# Patient Record
Sex: Female | Born: 1955 | Race: White | Hispanic: No | Marital: Married | Smoking: Current every day smoker
Health system: Southern US, Community
[De-identification: ages and names within clinical notes are randomized; demographics above are authoritative.]

## PROBLEM LIST (undated history)

## (undated) DIAGNOSIS — F32A Depression, unspecified: Secondary | ICD-10-CM

## (undated) DIAGNOSIS — M1712 Unilateral primary osteoarthritis, left knee: Secondary | ICD-10-CM

## (undated) DIAGNOSIS — M797 Fibromyalgia: Secondary | ICD-10-CM

## (undated) DIAGNOSIS — F329 Major depressive disorder, single episode, unspecified: Secondary | ICD-10-CM

## (undated) DIAGNOSIS — I10 Essential (primary) hypertension: Secondary | ICD-10-CM

## (undated) DIAGNOSIS — K635 Polyp of colon: Secondary | ICD-10-CM

## (undated) DIAGNOSIS — K602 Anal fissure, unspecified: Secondary | ICD-10-CM

## (undated) DIAGNOSIS — M1990 Unspecified osteoarthritis, unspecified site: Secondary | ICD-10-CM

## (undated) DIAGNOSIS — H9190 Unspecified hearing loss, unspecified ear: Secondary | ICD-10-CM

## (undated) HISTORY — PX: CARPAL TUNNEL RELEASE: SHX101

## (undated) HISTORY — PX: TUBAL LIGATION: SHX77

## (undated) HISTORY — DX: Fibromyalgia: M79.7

## (undated) HISTORY — DX: Major depressive disorder, single episode, unspecified: F32.9

## (undated) HISTORY — DX: Anal fissure, unspecified: K60.2

## (undated) HISTORY — DX: Depression, unspecified: F32.A

## (undated) HISTORY — PX: COLONOSCOPY W/ POLYPECTOMY: SHX1380

## (undated) HISTORY — PX: PARTIAL KNEE ARTHROPLASTY: SHX2174

## (undated) HISTORY — DX: Unspecified osteoarthritis, unspecified site: M19.90

## (undated) HISTORY — DX: Polyp of colon: K63.5

---

## 2003-05-02 ENCOUNTER — Other Ambulatory Visit: Admission: RE | Admit: 2003-05-02 | Discharge: 2003-05-02 | Payer: Self-pay | Admitting: Obstetrics and Gynecology

## 2004-05-12 ENCOUNTER — Ambulatory Visit (HOSPITAL_BASED_OUTPATIENT_CLINIC_OR_DEPARTMENT_OTHER): Admission: RE | Admit: 2004-05-12 | Discharge: 2004-05-12 | Payer: Self-pay | Admitting: Family Medicine

## 2004-06-18 ENCOUNTER — Ambulatory Visit (HOSPITAL_BASED_OUTPATIENT_CLINIC_OR_DEPARTMENT_OTHER): Admission: RE | Admit: 2004-06-18 | Discharge: 2004-06-18 | Payer: Self-pay | Admitting: Family Medicine

## 2005-09-29 ENCOUNTER — Ambulatory Visit (HOSPITAL_BASED_OUTPATIENT_CLINIC_OR_DEPARTMENT_OTHER): Admission: RE | Admit: 2005-09-29 | Discharge: 2005-09-29 | Payer: Self-pay | Admitting: Orthopedic Surgery

## 2009-07-30 ENCOUNTER — Encounter (INDEPENDENT_AMBULATORY_CARE_PROVIDER_SITE_OTHER): Payer: Self-pay | Admitting: *Deleted

## 2009-09-02 ENCOUNTER — Encounter (INDEPENDENT_AMBULATORY_CARE_PROVIDER_SITE_OTHER): Payer: Self-pay | Admitting: *Deleted

## 2009-09-03 ENCOUNTER — Ambulatory Visit: Payer: Self-pay | Admitting: Internal Medicine

## 2009-09-17 ENCOUNTER — Ambulatory Visit: Payer: Self-pay | Admitting: Internal Medicine

## 2009-09-20 ENCOUNTER — Encounter: Payer: Self-pay | Admitting: Internal Medicine

## 2009-09-25 ENCOUNTER — Telehealth: Payer: Self-pay | Admitting: Internal Medicine

## 2009-09-26 ENCOUNTER — Encounter (INDEPENDENT_AMBULATORY_CARE_PROVIDER_SITE_OTHER): Payer: Self-pay | Admitting: *Deleted

## 2009-10-07 ENCOUNTER — Ambulatory Visit: Payer: Self-pay | Admitting: Internal Medicine

## 2009-10-07 DIAGNOSIS — M129 Arthropathy, unspecified: Secondary | ICD-10-CM | POA: Insufficient documentation

## 2009-10-07 DIAGNOSIS — R141 Gas pain: Secondary | ICD-10-CM

## 2009-10-07 DIAGNOSIS — R143 Flatulence: Secondary | ICD-10-CM

## 2009-10-07 DIAGNOSIS — Z860100 Personal history of colon polyps, unspecified: Secondary | ICD-10-CM | POA: Insufficient documentation

## 2009-10-07 DIAGNOSIS — IMO0001 Reserved for inherently not codable concepts without codable children: Secondary | ICD-10-CM | POA: Insufficient documentation

## 2009-10-07 DIAGNOSIS — R197 Diarrhea, unspecified: Secondary | ICD-10-CM | POA: Insufficient documentation

## 2009-10-07 DIAGNOSIS — R142 Eructation: Secondary | ICD-10-CM

## 2009-10-08 LAB — CONVERTED CEMR LAB
ALT: 20 U/L
AST: 18 U/L
Albumin: 3.8 g/dL
Alkaline Phosphatase: 62 U/L
BUN: 10 mg/dL
Basophils Absolute: 0 K/uL
Basophils Relative: 0.7 %
CO2: 29 meq/L
Calcium: 9.2 mg/dL
Chloride: 105 meq/L
Creatinine, Ser: 0.8 mg/dL
Eosinophils Absolute: 0.1 K/uL
Eosinophils Relative: 1.9 %
GFR calc non Af Amer: 79.09 mL/min
Glucose, Bld: 99 mg/dL
HCT: 46.3 % — ABNORMAL HIGH
Hemoglobin: 15 g/dL
Lymphocytes Relative: 20.9 %
Lymphs Abs: 1.4 K/uL
MCHC: 32.4 g/dL
MCV: 93.2 fL
Monocytes Absolute: 0.4 K/uL
Monocytes Relative: 6.1 %
Neutro Abs: 4.9 K/uL
Neutrophils Relative %: 70.4 %
Platelets: 244 K/uL
Potassium: 4 meq/L
RBC: 4.96 M/uL
RDW: 13.1 %
Sed Rate: 19 mm/h
Sodium: 141 meq/L
TSH: 1.17 u[IU]/mL
Total Bilirubin: 0.6 mg/dL
Total Protein: 6.9 g/dL
WBC: 6.8 10*3/microliter

## 2009-10-10 LAB — CONVERTED CEMR LAB: Tissue Transglutaminase Ab, IgA: 0.6 units (ref <7)

## 2009-10-13 ENCOUNTER — Ambulatory Visit (HOSPITAL_COMMUNITY): Admission: RE | Admit: 2009-10-13 | Discharge: 2009-10-13 | Payer: Self-pay | Admitting: Internal Medicine

## 2009-12-01 ENCOUNTER — Ambulatory Visit: Payer: Self-pay | Admitting: Internal Medicine

## 2009-12-01 DIAGNOSIS — K602 Anal fissure, unspecified: Secondary | ICD-10-CM

## 2010-04-01 ENCOUNTER — Encounter (INDEPENDENT_AMBULATORY_CARE_PROVIDER_SITE_OTHER): Payer: Self-pay | Admitting: *Deleted

## 2010-07-20 ENCOUNTER — Emergency Department (HOSPITAL_COMMUNITY)
Admission: EM | Admit: 2010-07-20 | Discharge: 2010-07-20 | Payer: Self-pay | Source: Home / Self Care | Admitting: Emergency Medicine

## 2010-08-08 ENCOUNTER — Encounter: Payer: Self-pay | Admitting: Obstetrics and Gynecology

## 2010-08-18 NOTE — Miscellaneous (Signed)
Summary: [facility] Previsit/prep  Clinical Lists Changes  Medications: Added new medication of DULCOLAX 5 MG  TBEC (BISACODYL) Day before procedure take 2 at 3pm and 2 at 8pm. - Signed Added new medication of METOCLOPRAMIDE HCL 10 MG  TABS (METOCLOPRAMIDE HCL) As per prep instructions. - Signed Added new medication of MIRALAX   POWD (POLYETHYLENE GLYCOL 3350) As per prep  instructions. - Signed Rx of DULCOLAX 5 MG  TBEC (BISACODYL) Day before procedure take 2 at 3pm and 2 at 8pm.;  #4 x 0;  Signed;  Entered by: Wyona Almas RN;  Authorized by: Hart Carwin MD;  Method used: Electronically to [facility]  [phone]*, [address], [place], [place], [place]  [address], Ph: [phone] or [phone], Fax: [phone] Rx of METOCLOPRAMIDE HCL 10 MG  TABS (METOCLOPRAMIDE HCL) As per prep instructions.;  #2 x 0;  Signed;  Entered by: Wyona Almas RN;  Authorized by: Hart Carwin MD;  Method used: Electronically to [facility]  [phone]*, [address] [place], [place], [place]  [address], Ph: [phone] or [phone], Fax: [phone] Rx of MIRALAX   POWD (POLYETHYLENE GLYCOL 3350) As per prep  instructions.;  #255gm x 0;  Signed;  Entered by: Wyona Almas RN;  Authorized by: Hart Carwin MD;  Method used: Electronically to [facility]  [phone]*, [address] [place], [place], [place]  [address], Ph: [phone] or [phone], Fax: [phone] Allergies: Added new allergy or adverse reaction of SULFA Observations: Added new observation of NKA: F (09/03/2009 8:43)    Prescriptions: MIRALAX   POWD (POLYETHYLENE GLYCOL 3350) As per prep  instructions.  #255gm x 0   Entered by:   Wyona Almas RN   Authorized by:   Hart Carwin MD   Signed by:   Wyona Almas RN on 09/03/2009   Method used:   Electronically to        [facility]  [phone]* (retail)       [address]       [place], [place]  [address]       Ph: [phone] or [phone]       Fax: [phone]   RxID:   [id number] METOCLOPRAMIDE HCL 10 MG  TABS (METOCLOPRAMIDE HCL) As per prep instructions.  #2 x 0   Entered by:   Wyona Almas RN   Authorized by:   Hart Carwin MD   Signed by:   Wyona Almas RN on 09/03/2009   Method used:   Electronically to        [facility]  [phone]* (retail)       [address]       [place], [place]  [address]       Ph: [phone] or [phone]       Fax: [phone]   RxID:   [id number] DULCOLAX 5 MG  TBEC (BISACODYL) Day before procedure take 2 at 3pm and 2 at 8pm.  #4 x 0   Entered by:   Wyona Almas RN   Authorized by:   Hart Carwin MD   Signed by:   Wyona Almas RN on 09/03/2009   Method used:   Electronically to        [facility]  [phone]* (retail)       [address]  [place], [place]  [address]       Ph: [phone] or [phone]       Fax: [phone]   RxID:   [id number]

## 2010-08-18 NOTE — Letter (Signed)
Summary: [facility] Instructions  [facility]  [address] [place], [place] [address]   Phone: [phone]  Fax: [phone]       Betty Carrillo    1955/12/26    MRN: [id number]       Procedure Day Dorna Bloom:  Wednesday  09/17/09     Arrival Time:  7:30am     Procedure Time:  8:30am     Location of Procedure:                    _ X_  [facility] (4th Floor)    PREPARATION FOR COLONOSCOPY WITH MIRALAX  Starting 5 days prior to your procedure   Saturday 02/26 do not eat nuts, seeds, popcorn, corn, beans, peas,  salads, or any raw vegetables.  Do not take any fiber supplements (e.g. Metamucil, Citrucel, and Benefiber). ____________________________________________________________________________________________________   THE DAY BEFORE YOUR PROCEDURE         DATE:  03/01  DAY:  Tuesday  1   Drink clear liquids the entire day-NO SOLID FOOD  2   Do not drink anything colored red or purple.  Avoid juices with pulp.  No orange juice.  3   Drink at least 64 oz. (8 glasses) of fluid/clear liquids during the day to prevent dehydration and help the prep work efficiently.  CLEAR LIQUIDS INCLUDE: Water Jello Ice Popsicles Tea (sugar ok, no milk/cream) Powdered fruit flavored drinks Coffee (sugar ok, no milk/cream) Gatorade Juice: apple, white grape, white cranberry  Lemonade Clear bullion, consomm, broth Carbonated beverages (any kind) Strained chicken noodle soup Hard Candy  4   Mix the entire bottle of Miralax with 64 oz. of Gatorade/Powerade in the morning and put in the refrigerator to chill.  5   At 3:00 pm take 2 Dulcolax/Bisacodyl tablets.  6   At 4:30 pm take one Reglan/Metoclopramide tablet.  7  Starting at 5:00 pm drink one 8 oz glass of the Miralax mixture every 15-20 minutes until you have finished drinking the entire 64 oz.  You should finish drinking prep around 7:30 or 8:00 pm.  8   If you are nauseated, you may take the 2nd  Reglan/Metoclopramide tablet at 6:30 pm.        9    At 8:00 pm take 2 more DULCOLAX/Bisacodyl tablets.     THE DAY OF YOUR PROCEDURE      DATE:   03/02  DAY: Wednesday  You may drink clear liquids until  6:30am   (2 HOURS BEFORE PROCEDURE).   MEDICATION INSTRUCTIONS  Unless otherwise instructed, you should take regular prescription medications with a small sip of water as early as possible the morning of your procedure.         OTHER INSTRUCTIONS  You will need a responsible adult at least 56 years of age to accompany you and drive you home.   This person must remain in the waiting room during your procedure.  Wear loose fitting clothing that is easily removed.  Leave jewelry and other valuables at home.  However, you may wish to bring a book to read or an iPod/MP3 player to listen to music as you wait for your procedure to start.  Remove all body piercing jewelry and leave at home.  Total time from sign-in until discharge is approximately 2-3 hours.  You should go home directly after your procedure and rest.  You can resume normal activities the day after your procedure.  The day of  your procedure you should not:   Drive   Make legal decisions   Operate machinery   Drink alcohol   Return to work  You will receive specific instructions about eating, activities and medications before you leave.   The above instructions have been reviewed and explained to me by  Wyona Almas RN  September 03, 2009 9:04 AM     I fully understand and can verbalize these instructions _____________________________ Date _______

## 2010-08-18 NOTE — Letter (Signed)
Summary: Patient Notice- Polyp Results  [facility]  [address] [place], [place] [address]   Phone: [phone]  Fax: [phone]        September 20, 2009 MRN: [id number]    CARMEN TOLLIVER [address] [place], [place]  [address]    Dear Ms. VANBUREN,  I am pleased to inform you that the colon polyp(s) removed during your recent colonoscopy was (were) found to be benign (no cancer detected) upon pathologic examination.The polyp was hyperplastic ( not precancerous)  I recommend you have a repeat colonoscopy examination in 10_ years to look for recurrent polyps, as having colon polyps increases your risk for having recurrent polyps or even colon cancer in the future.  Should you develop new or worsening symptoms of abdominal pain, bowel habit changes or bleeding from the rectum or bowels, please schedule an evaluation with either your primary care physician or with me.  Additional information/recommendations:  _x_ No further action with gastroenterology is needed at this time. Please      follow-up with your primary care physician for your other healthcare      needs.  __ Please call [phone] to schedule a return visit to review your      situation.  __ Please keep your follow-up visit as already scheduled.  __ Continue treatment plan as outlined the day of your exam.  Please call us if you are having persistent problems or have questions about your condition that have not been fully answered at this time.  Sincerely,  Hart Carwin MD  This letter has been electronically signed by your physician.  Appended Document: Patient Notice- Polyp Results Letter mailed 3.8.11

## 2010-08-18 NOTE — Progress Notes (Signed)
Summary: Triage  Phone Note Call from Patient Call back at Home Phone [phone]   Caller: Patient Call For: Dr. Juanda Chance Reason for Call: Talk to Nurse Summary of Call: Pt had a colonoscopy and is still experiencing alot of "gas" and diarrhea. Initial call taken by: Karna Christmas,  September 25, 2009 9:29 AM  Follow-up for Phone Call        Message left for patient to callback. Laureen Ochs LPN  September 25, 2009 10:01 AM   Pt. left a message for me to call her on 09-26-09 after 12:30pm at [phone]. I wil try her back then.  Follow-up by: Laureen Ochs LPN,  September 25, 2009 11:27 AM  Additional Follow-up for Phone Call Additional follow up Details #1::        Pt. states she has constant gas and constant diarrhea, she has episodes of stool incontinence. Used Gas-x and Pepto, no improvement. Wants to schedule an appt. to see Dr.Anav Lammert.   1) See Dr.Bren Borys on 10-07-09 at 9am 2) Use 1 immodium every morning and as needed for diarrhea. 3) Gas-x,Phazyme, etc. as needed for gas & bloating. 4) Pt. instructed to call back as needed.  3)  Additional Follow-up by: Laureen Ochs LPN,  September 26, 2009 3:17 PM

## 2010-08-18 NOTE — Procedures (Signed)
Summary: Colonoscopy  Patient: [name] Note: All result statuses are Final unless otherwise noted.  Tests: (1) Colonoscopy (COL)   COL Colonoscopy           DONE     [facility]     [address]     [place], [place]  [address]           COLONOSCOPY PROCEDURE REPORT           PATIENT:  Betty Carrillo, Betty Carrillo  MR#:  [id number]     BIRTHDATE:  11/05/1955, 54 yrs. old  GENDER:  female           ENDOSCOPIST:  Hedwig Morton. Juanda Chance, MD     Referred by:  Cannon Kettle, N.P.           PROCEDURE DATE:  09/17/2009     PROCEDURE:  Colonoscopy [address]     ASA CLASS:  Class I     INDICATIONS:  Routine Risk Screening           MEDICATIONS:   Versed 8 mg, Fentanyl 75 mcg           DESCRIPTION OF PROCEDURE:   After the risks benefits and     alternatives of the procedure were thoroughly explained, informed     consent was obtained.  Digital rectal exam was performed and     revealed no rectal masses.   The LB [id number] endoscope     was introduced through the anus and advanced to the cecum, which     was identified by both the appendix and ileocecal valve, without     limitations.  The quality of the prep was good, using MiraLax.     The instrument was then slowly withdrawn as the colon was fully     examined.     <<PROCEDUREIMAGES>>           FINDINGS:  A diminutive polyp was found in the sigmoid colon. at     10 cm 3 mm sessile polyp removed The polyp was removed using cold     biopsy forceps (see image3).  This was otherwise a normal     examination of the colon (see image4, image2, and image1).     Retroflexion was not performed.  The scope was then withdrawn from     the patient and the procedure completed.           COMPLICATIONS:  None           ENDOSCOPIC IMPRESSION:     1) Diminutive polyp in the sigmoid colon     2) Otherwise normal examination     RECOMMENDATIONS:     1) Await pathology results           REPEAT EXAM:  In 10 year(s) for.        ______________________________     Hedwig Morton. Juanda Chance, MD           CC:           n.     eSIGNED:   Hedwig Morton. Elyssa Pendelton at 09/17/2009 09:14 AM           Dajana, Gehrig, [phone]  Note: An exclamation mark (!) indicates a result that was not dispersed into the flowsheet. Document Creation Date: 09/17/2009 9:14 AM _______________________________________________________________________  (1) Order result status: Final Collection or observation date-time: 09/17/2009 09:06 Requested date-time:  Receipt date-time:  Reported date-time:  Referring Physician:   Ordering Physician: Lina Sar (  [id number]) Specimen Source:  Source: Launa Grill Order Number: [id number] Lab site:   Appended Document: Colonoscopy     Procedures Next Due Date:    Colonoscopy: 09/2019

## 2010-08-18 NOTE — Letter (Signed)
Summary: Previsit letter  [facility]  [address] [place], [place] [address]   Phone: [phone]  Fax: [phone]       07/30/2009 MRN: [id number]  Betty Carrillo [address] [place], [place]  [address]  Dear [name],  Welcome to the Gastroenterology Division at [facility].    You are scheduled to see a nurse for your pre-procedure visit on 08-11-09 at 2:00p.m. on the 3rd floor at [facility], [address]  We ask that you try to arrive at our office 15 minutes prior to your appointment time to allow for check-in.  Your nurse visit will consist of discussing your medical and surgical history, your immediate family medical history, and your medications.    Please bring a complete list of all your medications or, if you prefer, bring the medication bottles and we will list them.  We will need to be aware of both prescribed and over the counter drugs.  We will need to know exact dosage information as well.  If you are on blood thinners (Coumadin, Plavix, Aggrenox, Ticlid, etc.) please call our office today/prior to your appointment, as we need to consult with your physician about holding your medication.   Please be prepared to read and sign documents such as consent forms, a financial agreement, and acknowledgement forms.  If necessary, and with your consent, a friend or relative is welcome to sit-in on the nurse visit with you.  Please bring your insurance card so that we may make a copy of it.  If your insurance requires a referral to see a specialist, please bring your referral form from your primary care physician.  No co-pay is required for this nurse visit.     If you cannot keep your appointment, please call [phone] to cancel or reschedule prior to your appointment date.  This allows [place] the opportunity to schedule an appointment for another patient in need of care.    Thank you for choosing [facility] for your medical needs.   We appreciate the opportunity to care for you.  Please visit [place] at our website  to learn more about our practice.                     Sincerely.                                                                                                                   The Gastroenterology Division

## 2010-08-18 NOTE — Assessment & Plan Note (Signed)
Summary: GAS & DIARRHEA                  DEBORAH   History of Present Illness Visit Type: new patient  Primary GI MD: Lina Sar MD Requesting Provider: n/a Chief Complaint: Rectal pain, diarrhea, gas, and nausea  History of Present Illness:   This is a 55 year old white female who is status post recent screening colonoscopy last week week which showed one hyperplastic polyp. She is complaining of a one-year history of crampy abdominal pain followed by some loose urgent bowel movements with occasional seepage of loose stools through the rectum. She denies any rectal bleeding. Her weight has been steadily increasing. 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 She usually does not eat breakfast but has a sandwich for lunch and usually does not cook supper. She does not drink alcohol and does not smoke. There is a positive family history of gallbladder disease in her mother. Patient has tried Pepto-Bismol and Gas-X. She also tried a lactose free diet. She denies any diarrhea at night.   GI Review of Systems    Reports belching, bloating, nausea, and  weight gain.      Denies abdominal pain, acid reflux, chest pain, dysphagia with liquids, dysphagia with solids, heartburn, loss of appetite, vomiting, vomiting blood, and  weight loss.      Reports change in bowel habits, diarrhea, and  rectal pain.     Denies anal fissure, black tarry stools, constipation, diverticulosis, fecal incontinence, heme positive stool, hemorrhoids, irritable bowel syndrome, jaundice, light color stool, liver problems, and  rectal bleeding.    Current Medications (verified): 1)  None  Allergies (verified): 1)  ! Sulfa  Past History:  Past Medical History: Current Problems:  ABDOMINAL BLOATING (ICD-787.3) DIARRHEA (ICD-787.91) FIBROMYALGIA (ICD-729.1) COLONIC POLYPS, HX OF (ICD-V12.72) ARTHRITIS (ICD-716.90)  Past Surgical History: Carpal Tunnel Release Bilateral  Tubal Ligation  Family  History: No FH of Colon Cancer: Bone Cancer: Father  Family History of Colitis/Crohn's:Mother and Sister  Family History of Diabetes: Mother  Family History of Heart Disease: Mother  Family History of Liver Disease/Cirrhosis:Father   Social History: Occupation: Conservation officer, nature Married 2 childern Patient has never smoked.  Alcohol Use - no Daily Caffeine Use: one daily  Illicit Drug Use - no Smoking Status:  never Drug Use:  no  Review of Systems       The patient complains of hearing problems.  The patient denies allergy/sinus, anemia, anxiety-new, arthritis/joint pain, back pain, blood in urine, breast changes/lumps, change in vision, confusion, cough, coughing up blood, depression-new, fainting, fatigue, fever, headaches-new, heart murmur, heart rhythm changes, itching, menstrual pain, muscle pains/cramps, night sweats, nosebleeds, pregnancy symptoms, shortness of breath, skin rash, sleeping problems, sore throat, swelling of feet/legs, swollen lymph glands, thirst - excessive , urination - excessive , urination changes/pain, urine leakage, vision changes, and voice change.         Pertinent positive and negative review of systems were noted in the above HPI. All other ROS was otherwise negative.   Vital Signs:  Patient profile:   55 year old female Height:      61 inches Weight:      156 pounds BMI:     29.58 BSA:     1.70 Pulse rate:   72 / minute Pulse rhythm:   regular BP sitting:   124 / 80  (left arm) Cuff size:   regular  Vitals Entered By: Ok Anis CMA (October 07, 2009 9:34 AM)  Physical Exam  General:  Well developed, well nourished, no acute distress. Eyes:  PERRLA, no icterus. Mouth:  No deformity or lesions, dentition normal. Neck:  Supple; no masses or thyromegaly. Lungs:  Clear throughout to auscultation. Heart:  Regular rate and rhythm; no murmurs, rubs,  or bruits. Abdomen:  soft protuberant abdomen which is mildly tender in left lower quadrant. Liver edge  at costal margin. No ascites. No palpable mass. Msk:  Symmetrical with no gross deformities. Normal posture. Extremities:  superficial varicose veins but no edema. Skin:  Intact without significant lesions or rashes. Psych:  Alert and cooperative. Normal mood and affect.   Impression & Recommendations:  Problem # 1:  ABDOMINAL BLOATING (ICD-787.3) Patient  had a change in bowel habits consistent with irritable bowel syndrome. We will check her for celiac disease. We need to rule out thyrotoxicosis or gallbladder dysfunction. We will start her on a fiber supplement and Bentyl 20 mg twice a day. I will reassess her in 8 weeks. She received a booklet on IBS.  Orders: TLB-CBC Platelet - w/Differential (85025-CBCD) TLB-TSH (Thyroid Stimulating Hormone) (84443-TSH) TLB-CMP (Comprehensive Metabolic Pnl) (80053-COMP) T-Tissue Transglutamase Ab IgA (95621-30865) TLB-Sedimentation Rate (ESR) (85652-ESR) Ultrasound Abdomen (UAS)  Problem # 2:  COLONIC POLYPS, HX OF (ICD-V12.72) Patient had a hyperplastic polyp seen on a colonoscopy in March 2011. A recall colonoscopy will be due in March 2021.  Patient Instructions: 1)  Please go for your scheduled abdominal ultrasound scheduled for 10/10/09 (Friday) @ 9:00 am at [facility]. 2)  Please go to the basement for labwork including CBC, CMET, TtG, Sedimentation rate, TSH. 3)  Please pick up your Bentyl 20 mg two times a day prescription at the pharmacy. 4)  Please increse your fiber intake. You can get metamucil over the counter. 5)  You have been given a brochure on Irritable Bowel Syndrome. 6)  The medication list was reviewed and reconciled.  All changed / newly prescribed medications were explained.  A complete medication list was provided to the patient / caregiver. Prescriptions: BENTYL 20 MG TABS (DICYCLOMINE HCL) Take 1 tablet by mouth two times a day  #60 x 2   Entered by:   Hortense Ramal CMA (AAMA)   Authorized by:   Hart Carwin  MD   Signed by:   Hortense Ramal CMA (AAMA) on 10/07/2009   Method used:   Electronically to        [facility]  [phone]* (retail)       [address]       [place], [place]  [address]       Ph: [phone] or [phone]       Fax: [phone]   RxID:   [id number]

## 2010-08-18 NOTE — Letter (Signed)
Summary: Office Visit Letter  [facility]  [address] [place], [place] [address]   Phone: [phone]  Fax: [phone]      April 01, 2010 MRN: [id number]   Betty Carrillo [address] [place], [place]  [address]   Dear Ms. LEANOS,   According to our records, it is time for you to schedule a follow-up office visit with [place] in November.   At your convenience, please call [phone] (option #2)to schedule an office visit. If you have any questions, concerns, or feel that this letter is in error, we would appreciate your call.   Sincerely,  Hedwig Morton. Juanda Chance, M.D.  [facility] [phone]

## 2010-08-18 NOTE — Letter (Signed)
Summary: New Patient letter  [facility]  [address] [place], [place] [address]   Phone: [phone]  Fax: [phone]       09/26/2009 MRN: [id number]  Betty Carrillo [address] [place], [place]  [address]  Dear Ms. Betty Carrillo,  Welcome to the Gastroenterology Division at [facility].    You are scheduled to see Dr.  Lina Sar on 10-07-09 at 9am, on the 3rd floor at [facility], [address].  We ask that you try to arrive at our office 15 minutes prior to your appointment time to allow for check-in.  We would like you to complete the enclosed self-administered evaluation form prior to your visit and bring it with you on the day of your appointment.  We will review it with you.  Also, please bring a complete list of all your medications or, if you prefer, bring the medication bottles and we will list them.  Please bring your insurance card so that we may make a copy of it.  If your insurance requires a referral to see a specialist, please bring your referral form from your primary care physician.  Co-payments are due at the time of your visit and may be paid by cash, check or credit card.     Your office visit will consist of a consult with your physician (includes a physical exam), any laboratory testing he/she may order, scheduling of any necessary diagnostic testing (e.g. x-ray, ultrasound, CT-scan), and scheduling of a procedure (e.g. Endoscopy, Colonoscopy) if required.  Please allow enough time on your schedule to allow for any/all of these possibilities.    If you cannot keep your appointment, please call [phone] to cancel or reschedule prior to your appointment date.  This allows [place] the opportunity to schedule an appointment for another patient in need of care.  If you do not cancel or reschedule by 5 p.m. the business day prior to your appointment date, you will be charged a $50.00 late cancellation/no-show fee.    Thank you for choosing  [facility] for your medical needs.  We appreciate the opportunity to care for you.  Please visit [place] at our website  to learn more about our practice.                     Sincerely,                                                             The Gastroenterology Division   Appended Document: New Patient letter Letter mailed to patient.

## 2010-08-18 NOTE — Assessment & Plan Note (Signed)
Summary: F/U APPT...LSW.   History of Present Illness Visit Type: follow up  Primary GI MD: Lina Sar MD Primary Provider: Silas Sacramento,  FNP Requesting Provider: n/a Chief Complaint: F/u for diarrhea, gas, and nausea. Pt states that her stool is better but she is still having nausea and gas  History of Present Illness:   This is a 55 year old white female who has irritable bowel syndrome with predominant diarrhea and rectal leakage and intermittent hematochezia. A colonoscopy in March 2011 showed a hyperplastic polyp. She has been somewhat improved on Bentyl 20 mg twice a day. She has a great deal of stress working at [employer]. Her upper abdominal ultrasound was negative. Her tissue transglutaminase, CBC and metabolic panel as well as a sedimentation rate were normal. She describes clear mucousy leakage from the rectum associated with rectal soreness and blood with wiping.   GI Review of Systems    Reports nausea.      Denies abdominal pain, acid reflux, belching, bloating, chest pain, dysphagia with liquids, dysphagia with solids, heartburn, loss of appetite, vomiting, vomiting blood, weight loss, and  weight gain.        Denies anal fissure, black tarry stools, change in bowel habit, constipation, diarrhea, diverticulosis, fecal incontinence, heme positive stool, hemorrhoids, irritable bowel syndrome, jaundice, light color stool, liver problems, rectal bleeding, and  rectal pain.    Current Medications (verified): 1)  Bentyl 20 Mg Tabs (Dicyclomine Hcl) .... Take 1 Tablet By Mouth Two Times A Day 2)  Metamucil  Wafr (Psyllium) .... Daily  Allergies (verified): 1)  ! Sulfa  Past History:  Past Medical History: Reviewed history from 10/07/2009 and no changes required. Current Problems:  ABDOMINAL BLOATING (ICD-787.3) DIARRHEA (ICD-787.91) FIBROMYALGIA (ICD-729.1) COLONIC POLYPS, HX OF (ICD-V12.72) ARTHRITIS (ICD-716.90)  Past Surgical History: Reviewed history from 10/07/2009  and no changes required. Carpal Tunnel Release Bilateral  Tubal Ligation  Family History: Reviewed history from 10/07/2009 and no changes required. No FH of Colon Cancer: Bone Cancer: Father  Family History of Colitis/Crohn's:Mother and Sister  Family History of Diabetes: Mother  Family History of Heart Disease: Mother  Family History of Liver Disease/Cirrhosis:Father   Social History: Reviewed history from 10/07/2009 and no changes required. Occupation: Conservation officer, nature Married 2 childern Patient has never smoked.  Alcohol Use - no Daily Caffeine Use: one daily  Illicit Drug Use - no  Review of Systems  The patient denies allergy/sinus, anemia, anxiety-new, arthritis/joint pain, back pain, blood in urine, breast changes/lumps, change in vision, confusion, cough, coughing up blood, depression-new, fainting, fatigue, fever, headaches-new, hearing problems, heart murmur, heart rhythm changes, itching, menstrual pain, muscle pains/cramps, night sweats, nosebleeds, pregnancy symptoms, shortness of breath, skin rash, sleeping problems, sore throat, swelling of feet/legs, swollen lymph glands, thirst - excessive , urination - excessive , urination changes/pain, urine leakage, vision changes, and voice change.         Pertinent positive and negative review of systems were noted in the above HPI. All other ROS was otherwise negative.   Vital Signs:  Patient profile:   55 year old female Height:      61 inches Weight:      160 pounds BMI:     30.34 BSA:     1.72 Pulse rate:   64 / minute Pulse rhythm:   regular BP sitting:   126 / 72  (left arm) Cuff size:   regular  Vitals Entered By: Ok Anis CMA (Dec 01, 2009 8:19 AM)  Physical Exam  General:  Well developed, well nourished, no acute distress. Eyes:  PERRLA, no icterus. Mouth:  No deformity or lesions, dentition normal. Lungs:  Clear throughout to auscultation. Heart:  Regular rate and rhythm; no murmurs, rubs,  or  bruits. Abdomen:  Soft, nontender and nondistended. No masses, hepatosplenomegaly or hernias noted. Normal bowel sounds. Rectal:  normal perianal area. Very tender anal canal. 5 mm anal fissure at 9:00. Mucosa of the rectal ampulla is normal. Stool is Hemoccult negative and there are no hemorrhoids. Extremities:  No clubbing, cyanosis, edema or deformities noted. Skin:  Intact without significant lesions or rashes. Psych:  Alert and cooperative. Normal mood and affect.   Impression & Recommendations:  Problem # 1:  DIARRHEA (ICD-787.91) Patient has irritable bowel syndrome with predominant diarrhea. Her symptoms are related to stress at work. We will start her on Paxil 10 mg at bedtime and continue Bentyl 20 mg twice a day.  Problem # 2:  ANAL FISSURE (ICD-565.0) Paitent has a 5 mm anal fissure at 9:00 on  today's exam. We will start her on Analpram cream 2.5%  3 times a day p.r.n.  Patient Instructions: 1)  Bentyl 20 mg p.o. b.i.d. 2)  High-fiber diet. 3)  Paxil 10 mg q.h.s. 4)  Analpram cream 2.5% to use t.i.d. p.r.n. 5)  Office visit 6 months. 6)  Copy sent to : Dr Silas Sacramento 7)  The medication list was reviewed and reconciled.  All changed / newly prescribed medications were explained.  A complete medication list was provided to the patient / caregiver. Prescriptions: PAXIL 10 MG TABS (PAROXETINE HCL) Take 1 tab by mouth at bedtime  #30 x 1   Entered by:   Lamona Curl CMA (AAMA)   Authorized by:   Hart Carwin MD   Signed by:   Lamona Curl CMA (AAMA) on 12/01/2009   Method used:   Electronically to        [facility]  [phone]* (retail)       [address]       [place], [place]  [address]       Ph: [phone] or [phone]       Fax: [phone]   RxID:   [id number] ANALPRAM-HC 1-2.5 % CREA (HYDROCORTISONE ACE-PRAMOXINE) Apply to rectum three times a day as needed for anal fissure  #30 grams x 1   Entered by:    Lamona Curl CMA (AAMA)   Authorized by:   Hart Carwin MD   Signed by:   Lamona Curl CMA (AAMA) on 12/01/2009   Method used:   Electronically to        [facility]  [phone]* (retail)       [address]       [place], [place]  [address]       Ph: [phone] or [phone]       Fax: [phone]   RxID:   [id number]

## 2010-09-28 LAB — CBC
HCT: 45.5 % (ref 36.0–46.0)
Hemoglobin: 15.7 g/dL — ABNORMAL HIGH (ref 12.0–15.0)
MCH: 30.7 pg (ref 26.0–34.0)
MCHC: 34.5 g/dL (ref 30.0–36.0)
MCV: 88.9 fL (ref 78.0–100.0)
Platelets: 216 10*3/uL (ref 150–400)
RBC: 5.12 MIL/uL — ABNORMAL HIGH (ref 3.87–5.11)
RDW: 12.3 % (ref 11.5–15.5)
WBC: 13.8 10*3/uL — ABNORMAL HIGH (ref 4.0–10.5)

## 2010-09-28 LAB — COMPREHENSIVE METABOLIC PANEL
ALT: 17 U/L (ref 0–35)
AST: 19 U/L (ref 0–37)
Albumin: 3.9 g/dL (ref 3.5–5.2)
Alkaline Phosphatase: 69 U/L (ref 39–117)
Chloride: 101 mEq/L (ref 96–112)
Potassium: 4.4 mEq/L (ref 3.5–5.1)
Sodium: 140 mEq/L (ref 135–145)
Total Protein: 7.2 g/dL (ref 6.0–8.3)

## 2010-09-28 LAB — DIFFERENTIAL
Basophils Relative: 0 % (ref 0–1)
Eosinophils Absolute: 0.1 10*3/uL (ref 0.0–0.7)
Eosinophils Relative: 1 % (ref 0–5)
Monocytes Absolute: 1.1 10*3/uL — ABNORMAL HIGH (ref 0.1–1.0)
Monocytes Relative: 8 % (ref 3–12)

## 2010-12-04 NOTE — Op Note (Signed)
[name], Betty Carrillo                ACCOUNT NO.:  [id number]   MEDICAL RECORD NO.:  [id number]          PATIENT TYPE:  AMB   LOCATION:  DSC                          FACILITY:  [facility]   PHYSICIAN:  Cindee Salt, M.D.       DATE OF BIRTH:  [birth year]   DATE OF PROCEDURE:  09/29/2005  DATE OF DISCHARGE:                                 OPERATIVE REPORT   PREOPERATIVE DIAGNOSIS:  Carpal tunnel syndrome, right hand.   POSTOPERATIVE DIAGNOSIS:  Carpal tunnel syndrome, right hand.   OPERATION:  Decompression right median nerve.   SURGEON:  Cindee Salt, M.D.   ASSISTANT:  Carolyne Fiscal R.N.   ANESTHESIA:  Forearm based IV regional.   HISTORY:  The patient is a 55 year old female with a history of carpal  tunnel syndrome, EMG nerve conductions positive which has not responded to  conservative treatment.   PROCEDURE:  The patient was seen in the preoperative area, procedure  discussed, questions answered.  The extremity and surgical site marked by  both the patient and surgeon.  The patient was taken to the operating room  where a forearm based IV regional anesthetic was carried out without  difficulty. She was prepped using DuraPrep, supine position, right arm free.  A longitudinal incision was made in the palm, carried down through  subcutaneous tissue. Bleeders were electrocauterized.  Palmar fascia was  split, superficial palmar arch identified.  The flexor tendon to the ring  and little finger identified to the ulnar side of median nerve.  The carpal  retinaculum was incised with sharp dissection, right angle and Sewall  retractor placed between skin forearm fascia. The fascia was released for  approximately a centimeter and a half proximal to the wrist crease. Area of  compression to the nerve was apparent. Tenosynovial tissue was moderately  thickened. No further lesions were identified. The wound was irrigated. The  skin was closed with interrupted 5-0 nylon sutures. A sterile compressive  dressing and splint was applied. The patient tolerated the procedure well  and was taken to the recovery room for observation in satisfactory  condition. She is discharged home to return to the [facility]  in one week on Vicodin.           ______________________________  Cindee Salt, M.D.     GK/MEDQ  D:  09/29/2005  T:  09/30/2005  Job:  [id number]   cc:   Teena Irani. Arlyce Dice, M.D.  Fax: [phone]

## 2010-12-04 NOTE — Procedures (Signed)
NAME:  Betty Carrillo, Betty Carrillo                ACCOUNT NO.:  [id number]   MEDICAL RECORD NO.:  [id number]          PATIENT TYPE:  OUT   LOCATION:  SLEEP CENTER                 FACILITY:  [facility]   PHYSICIAN:  Clinton D. Maple Hudson, M.D. DATE OF BIRTH:  [birth year]   DATE OF STUDY:                              NOCTURNAL POLYSOMNOGRAM   REFERRING PHYSICIAN:  Dr. Dara Hoyer.   INDICATION FOR STUDY:  Hypersomnia with sleep apnea.  CPAP titration study  requested.  Diagnostic NPSG May 12, 2004 recorded RDI of 15.8/hour.  Epworth sleepiness score 6/24.  BMI 27.2, weight 150 pounds.   SLEEP ARCHITECTURE:  Total sleep time 403 minutes with sleep efficiency 91%.  Stage 1 was 2%, stage 2 78%, stages 3 and 4 were absent, REM was 20% of  total sleep time.  Sleep latency 12.5 minutes.  REM latency 49.5 minutes.  Awake after sleep onset 28 minutes.  Arousal index 14.4.  No sleep  medications were reported.   RESPIRATORY DATA:  CPAP titration protocol.  CPAP was titrated to 12 CWP,  RDI of 1.6/hour using a petite Profile Comfort Gel mask and heated  humidifier.  The technician added a chin strap later because the patient was  opening her mouth.  This may be an indication that the pressure is set  higher than needed and an initial home pressure of 11 CWP might be  appropriate for trial.   OXYGEN DATA:  Snoring was controlled by CPAP.  Oxygen saturation on CPAP was  95-96%.   CARDIAC DATA:  Normal sinus rhythm with rare PACs.   MOVEMENT/PARASOMNIA:  A total of 39 limb jerks were recorded of which eight  were associated with arousal or awakening for a periodic limb movement with  arousal index of 1.2/hour which is probably not significant.  The technician  reported chewing or bruxism in sleep.   IMPRESSION/RECOMMENDATION:  CPAP titration to 12 CWP.  Consider initial home  trial at 11 CWP, respiratory disturbance index 5.6/hour, to reduce oral  venting and need for chin strap.  A petite Profile Comfort Gel  mask was used  with heated humidifier.                                                           Clinton D. Maple Hudson, M.D.  Diplomate, [employer]   CDY/MEDQ  D:  06/21/2004 15:10:04  T:  06/22/2004 15:31:42  Job:  [id number]

## 2010-12-04 NOTE — Procedures (Signed)
NAME:  Betty Carrillo, Betty Carrillo                ACCOUNT NO.:  [id number]   MEDICAL RECORD NO.:  [id number]          PATIENT TYPE:  OUT   LOCATION:  SLEEP CENTER                 FACILITY:  [facility]   PHYSICIAN:  Clinton D. Maple Hudson, M.D. DATE OF BIRTH:  Oct 22, 1955   DATE OF STUDY:  05/12/2004                              NOCTURNAL POLYSOMNOGRAM   STUDY DATE:  05/12/04   REFERRING PHYSICIAN:  Teena Irani. Arlyce Dice, M.D.   INDICATION FOR STUDY:  Hypersomnia with sleep apnea.   Epworth Sleepiness score was 9/24.  BMI 27.  Weight 150 pounds.   SLEEP ARCHITECTURE:  Total sleep time 327 minutes with sleep efficiency of  90%.  Stage I was 4%, stage II 41%, stages III and IV were 30%.  REM was 25%  of total sleep time.  Sleep latency 21 minutes.  REM latency 47 minutes.  Awake after sleep onset 19 minutes.  Arousal index 19.6.   RESPIRATORY DATA:  RDI 15.8 per hour reflecting 86 hypopnea's.  This is  consistent with mild to moderate obstructive sleep apnea/hypopnea syndrome.  Events were not positional.  REM RDI was 47 per hour.  Events developed late  in the night, mostly after 2 a.m. and there was insufficient time for the  CPAP titration by split study protocol.   OXYGEN DATA:  Moderately loud snoring with oxygen desaturation to a nadir of  84%.  Mean oxygen saturation through the study was 94% on room air.   CARDIAC DATA:  Normal sinus rhythm.   MOVEMENTS/PARASOMNIA:  Occasional leg jerks with insignificant effect on  sleep.   IMPRESSION/RECOMMENDATION:  Mild to moderate obstructive sleep  apnea/hypopnea syndrome, RDI 15.8 per hour with desaturation to 84%.  Consider return for CPAP titration or evaluation for alternative therapies  as appropriate.                                                           Clinton D. Maple Hudson, M.D.  Diplomate, [employer] Board   CDY/MEDQ  D:  05/24/2004 07:36:07  T:  05/24/2004 15:30:21  Job:  [id number]

## 2011-03-29 ENCOUNTER — Emergency Department (HOSPITAL_COMMUNITY)
Admission: EM | Admit: 2011-03-29 | Discharge: 2011-03-29 | Disposition: A | Discharge status: Home / Self Care | Payer: BC Managed Care – PPO | Attending: Emergency Medicine | Admitting: Emergency Medicine

## 2011-03-29 DIAGNOSIS — F29 Unspecified psychosis not due to a substance or known physiological condition: Secondary | ICD-10-CM | POA: Insufficient documentation

## 2011-03-29 DIAGNOSIS — F329 Major depressive disorder, single episode, unspecified: Secondary | ICD-10-CM | POA: Insufficient documentation

## 2011-03-29 DIAGNOSIS — F3289 Other specified depressive episodes: Secondary | ICD-10-CM | POA: Insufficient documentation

## 2011-03-29 LAB — DIFFERENTIAL
Eosinophils Absolute: 0.2 10*3/uL (ref 0.0–0.7)
Eosinophils Relative: 2 % (ref 0–5)
Lymphs Abs: 1.5 10*3/uL (ref 0.7–4.0)
Monocytes Absolute: 0.6 10*3/uL (ref 0.1–1.0)
Monocytes Relative: 6 % (ref 3–12)
Neutrophils Relative %: 78 % — ABNORMAL HIGH (ref 43–77)

## 2011-03-29 LAB — CBC
MCH: 30.4 pg (ref 26.0–34.0)
MCV: 88.3 fL (ref 78.0–100.0)
Platelets: 237 10*3/uL (ref 150–400)
RBC: 5.13 MIL/uL — ABNORMAL HIGH (ref 3.87–5.11)

## 2011-03-29 LAB — COMPREHENSIVE METABOLIC PANEL
AST: 19 U/L (ref 0–37)
CO2: 30 mEq/L (ref 19–32)
Calcium: 10.1 mg/dL (ref 8.4–10.5)
Creatinine, Ser: 0.7 mg/dL (ref 0.50–1.10)
GFR calc Af Amer: >60 mL/min (ref >60)
GFR calc non Af Amer: >60 mL/min (ref >60)

## 2011-03-29 LAB — RAPID URINE DRUG SCREEN, HOSP PERFORMED
Cocaine: NOT DETECTED
Opiates: NOT DETECTED
Tetrahydrocannabinol: NOT DETECTED

## 2011-07-02 ENCOUNTER — Ambulatory Visit: Payer: BC Managed Care – PPO | Admitting: Internal Medicine

## 2011-07-29 ENCOUNTER — Other Ambulatory Visit: Payer: Self-pay | Admitting: Obstetrics and Gynecology

## 2011-07-29 DIAGNOSIS — N6313 Unspecified lump in the right breast, lower outer quadrant: Secondary | ICD-10-CM

## 2011-08-05 ENCOUNTER — Other Ambulatory Visit: Payer: BC Managed Care – PPO

## 2011-08-06 ENCOUNTER — Other Ambulatory Visit: Payer: BC Managed Care – PPO

## 2011-08-23 ENCOUNTER — Other Ambulatory Visit: Payer: BC Managed Care – PPO

## 2011-08-26 ENCOUNTER — Ambulatory Visit
Admission: RE | Admit: 2011-08-26 | Discharge: 2011-08-26 | Disposition: A | Discharge status: Home / Self Care | Source: Ambulatory Visit | Attending: Obstetrics and Gynecology | Admitting: Obstetrics and Gynecology

## 2011-08-26 DIAGNOSIS — N6313 Unspecified lump in the right breast, lower outer quadrant: Secondary | ICD-10-CM

## 2013-01-10 ENCOUNTER — Encounter: Payer: Self-pay | Admitting: *Deleted

## 2013-02-06 ENCOUNTER — Ambulatory Visit (INDEPENDENT_AMBULATORY_CARE_PROVIDER_SITE_OTHER): Admitting: Internal Medicine

## 2013-02-06 ENCOUNTER — Encounter: Payer: Self-pay | Admitting: Internal Medicine

## 2013-02-06 VITALS — BP 136/84 | HR 68 | Ht 61.0 in | Wt 159.2 lb

## 2013-02-06 DIAGNOSIS — K589 Irritable bowel syndrome without diarrhea: Secondary | ICD-10-CM

## 2013-02-06 DIAGNOSIS — K648 Other hemorrhoids: Secondary | ICD-10-CM

## 2013-02-06 MED ORDER — HYDROCORTISONE ACETATE 25 MG RE SUPP: 25.0000 mg | Freq: Every day | RECTAL | Status: DC

## 2013-02-06 MED ORDER — DICYCLOMINE HCL 20 MG PO TABS: ORAL_TABLET | ORAL | Status: DC

## 2013-02-06 MED ORDER — BENEFIBER PO POWD: ORAL | Status: DC

## 2013-02-06 NOTE — Patient Instructions (Addendum)
We have sent the following medications to your pharmacy for you to pick up at your convenience: Anusol Bentyl  Please purchase Benefiber over the counter. Take 1 teaspoon (put in coffee or warm drink) daily.   Silas Sacramento NP

## 2013-02-06 NOTE — Progress Notes (Signed)
Betty Carrillo 27-Sep-1955 MRN [id number]  History of Present Illness:  This is a 56 year old white female with a history of diarrhea predominant irritable bowel syndrome  which was evaluated in March 2011 with a colonoscopy. The colonoscopy showed a hyperplastic polyp. Her upper abdominal ultrasound was negative and her tissue transglutaminase was normal. She comes back today with increased mucus in her stools and leakage  on her underpants. She has several urgent bowel movements a day and occasionally sees blood which she attributes to hemorrhoids. We put her on Bentyl 20 mg twice a day and Metamucil as well as Paxil 10 mg at bedtime in 2011  with much improvement in her symptoms but she ran out of the medications and did not come back.   Past Medical History  Diagnosis Date  . Fibromyalgia   . Depression   . Arthritis   . Colon polyp   . Anal fissure    Past Surgical History  Procedure Laterality Date  . Carpal tunnel release Bilateral   . Tubal ligation      reports that she has quit smoking. She has never used smokeless tobacco. She reports that she does not drink alcohol or use illicit drugs. family history includes Bone cancer in her father; Colitis in her mother and sister; Diabetes in her mothers; Heart disease in her mother; and Liver disease in her father.  There is no history of Colon cancer. Allergies  Allergen Reactions  . Sulfonamide Derivatives     REACTION: rash        Review of Systems: Occasionally crampy abdominal pain. He denies dysphagia heartburn  The remainder of the 10 point ROS is negative except as outlined in H&P   Physical Exam: General appearance  Well developed, in no distress. Eyes- non icteric. HEENT nontraumatic, normocephalic. Mouth no lesions, tongue papillated, no cheilosis. Neck supple without adenopathy, thyroid not enlarged, no carotid bruits, no JVD. Lungs Clear to auscultation bilaterally. Cor normal S1, normal S2, regular rhythm, no  murmur,  quiet precordium. Abdomen: Soft with normal active bowel sounds. Mild tenderness left lower quadrant. Normal epigastrium and right upper quadrant. Rectal: And anoscopic exam reveals normal perianal area. Slightly decreased rectal sphincter tone. Very small first grade internal hemorrhoids without any evidence of thrombosis or inflammation. Stool is Hemoccult negative. Extremities no pedal edema. Skin no lesions. Neurological alert and oriented x 3. Psychological normal mood and affect.  Assessment and Plan:  Problem #61 57 year old white female with irritable bowel syndrome with colon spasm causing frequent bowel movements. She will resume Bentyl 20 mg by mouth twice a day and Benefiber 1 teaspoon daily. Icreased rectal mucous is a sign of irritable bowel syndrome and local irritation possibly due to hemorrhoidal irritation from frequent bowel movements. We will start Anusol-HC suppositories for 12 days and give her 2 refills to use when necessary in the future. I assured her that the mucous is a normal function  to lubricate the stool. Her recall colonoscopy is set for a seven-year recall from 09/2009.   02/06/2013 Lina Sar

## 2015-03-06 ENCOUNTER — Encounter: Payer: Self-pay | Admitting: Internal Medicine

## 2016-08-23 ENCOUNTER — Encounter: Payer: Self-pay | Admitting: Gastroenterology

## 2018-08-02 ENCOUNTER — Encounter (HOSPITAL_COMMUNITY): Payer: Self-pay | Admitting: *Deleted

## 2018-08-15 NOTE — Patient Instructions (Addendum)
Betty Carrillo  08/15/2018   Your procedure is scheduled on: 08-22-2018   Report to [facility] Main  Entrance    Report to admitting at 10:50AM     Call this number if you have problems the morning of surgery [phone]      Remember: Do not eat food After Midnight. YOU MAY HAVE CLEAR LIQUIDS FROM MIDNIGHT UNTIL 7:15AM. NOTHING BY MOUTH AFTER 7:15AM!BRUSH YOUR TEETH MORNING OF SURGERY AND RINSE YOUR MOUTH OUT, NO CHEWING GUM CANDY OR MINTS.     CLEAR LIQUID DIET   Foods Allowed                                                                     Foods Excluded  Coffee and tea, regular and decaf                             liquids that you cannot  Plain Jell-O in any flavor                                             see through such as: Fruit ices (not with fruit pulp)                                     milk, soups, orange juice  Iced Popsicles                                    All solid food Carbonated beverages, regular and diet                                    Cranberry, grape and apple juices Sports drinks like Gatorade Lightly seasoned clear broth or consume(fat free) Sugar, honey syrup  Sample Menu Breakfast                                Lunch                                     Supper Cranberry juice                    Beef broth                            Chicken broth Jell-O                                     Grape juice  Apple juice Coffee or tea                        Jell-O                                      Popsicle                                                Coffee or tea                        Coffee or tea  _____________________________________________________________________       Take these medicines the morning of surgery with A SIP OF WATER: no prescribed medications                                 You may not have any metal on your body including hair pins and              piercings  Do not  wear jewelry, make-up, lotions, powders or perfumes, deodorant             Do not wear nail polish.  Do not shave  48 hours prior to surgery.              Men may shave face and neck.   Do not bring valuables to the hospital. [facility] IS NOT             RESPONSIBLE   FOR VALUABLES.  Contacts, dentures or bridgework may not be worn into surgery.  Leave suitcase in the car. After surgery it may be brought to your room.                   Please read over the following fact sheets you were given: _____________________________________________________________________             [facility] - Preparing for [facility] Before surgery, you can play an important role.  Because skin is not sterile, your skin needs to be as free of germs as possible.  You can reduce the number of germs on your skin by washing with CHG (chlorahexidine gluconate) soap before surgery.  CHG is an antiseptic cleaner which kills germs and bonds with the skin to continue killing germs even after washing. Please DO NOT use if you have an allergy to CHG or antibacterial soaps.  If your skin becomes reddened/irritated stop using the CHG and inform your nurse when you arrive at Short Stay. Do not shave (including legs and underarms) for at least 48 hours prior to the first CHG shower.  You may shave your face/neck. Please follow these instructions carefully:  1.  Shower with CHG Soap the night before surgery and the  morning of Surgery.  2.  If you choose to wash your hair, wash your hair first as usual with your  normal  shampoo.  3.  After you shampoo, rinse your hair and body thoroughly to remove the  shampoo.                           4.  Use  CHG as you would any other liquid soap.  You can apply chg directly  to the skin and wash                       Gently with a scrungie or clean washcloth.  5.  Apply the CHG Soap to your body ONLY FROM THE NECK DOWN.   Do not use on face/ open                           Wound or open  sores. Avoid contact with eyes, ears mouth and genitals (private parts).                       Wash face,  Genitals (private parts) with your normal soap.             6.  Wash thoroughly, paying special attention to the area where your surgery  will be performed.  7.  Thoroughly rinse your body with warm water from the neck down.  8.  DO NOT shower/wash with your normal soap after using and rinsing off  the CHG Soap.                9.  Pat yourself dry with a clean towel.            10.  Wear clean pajamas.            11.  Place clean sheets on your bed the night of your first shower and do not  sleep with pets. Day of Surgery : Do not apply any lotions/deodorants the morning of surgery.  Please wear clean clothes to the hospital/surgery center.  FAILURE TO FOLLOW THESE INSTRUCTIONS MAY RESULT IN THE CANCELLATION OF YOUR SURGERY PATIENT SIGNATURE_________________________________  NURSE SIGNATURE__________________________________  ________________________________________________________________________

## 2018-08-16 ENCOUNTER — Other Ambulatory Visit: Payer: Self-pay | Admitting: Orthopedic Surgery

## 2018-08-17 ENCOUNTER — Encounter (HOSPITAL_COMMUNITY)
Admission: RE | Admit: 2018-08-17 | Discharge: 2018-08-17 | Disposition: A | Discharge status: Home / Self Care | Source: Ambulatory Visit | Attending: Orthopedic Surgery | Admitting: Orthopedic Surgery

## 2018-08-17 ENCOUNTER — Encounter (HOSPITAL_COMMUNITY): Payer: Self-pay

## 2018-08-17 ENCOUNTER — Other Ambulatory Visit: Payer: Self-pay

## 2018-08-17 DIAGNOSIS — Z01818 Encounter for other preprocedural examination: Secondary | ICD-10-CM | POA: Insufficient documentation

## 2018-08-17 DIAGNOSIS — I10 Essential (primary) hypertension: Secondary | ICD-10-CM | POA: Insufficient documentation

## 2018-08-17 HISTORY — DX: Essential (primary) hypertension: I10

## 2018-08-17 LAB — CBC
HCT: 48.8 % — ABNORMAL HIGH (ref 36.0–46.0)
Hemoglobin: 15.5 g/dL — ABNORMAL HIGH (ref 12.0–15.0)
MCH: 29 pg (ref 26.0–34.0)
MCHC: 31.8 g/dL (ref 30.0–36.0)
MCV: 91.2 fL (ref 80.0–100.0)
Platelets: 229 10*3/uL (ref 150–400)
RBC: 5.35 MIL/uL — ABNORMAL HIGH (ref 3.87–5.11)
RDW: 12.4 % (ref 11.5–15.5)
WBC: 5.7 10*3/uL (ref 4.0–10.5)
nRBC: 0 % (ref 0.0–0.2)

## 2018-08-17 LAB — BASIC METABOLIC PANEL
Anion gap: 6 (ref 5–15)
BUN: 16 mg/dL (ref 8–23)
CO2: 29 mmol/L (ref 22–32)
Calcium: 8.9 mg/dL (ref 8.9–10.3)
Chloride: 102 mmol/L (ref 98–111)
Creatinine, Ser: 0.8 mg/dL (ref 0.44–1.00)
GFR calc Af Amer: >60 mL/min (ref >60)
GFR calc non Af Amer: >60 mL/min (ref >60)
GLUCOSE: 101 mg/dL — AB (ref 70–99)
Potassium: 4 mmol/L (ref 3.5–5.1)
Sodium: 137 mmol/L (ref 135–145)

## 2018-08-17 LAB — SURGICAL PCR SCREEN
MRSA, PCR: NEGATIVE
Staphylococcus aureus: NEGATIVE

## 2018-08-22 ENCOUNTER — Ambulatory Visit (HOSPITAL_COMMUNITY): Admitting: Anesthesiology

## 2018-08-22 ENCOUNTER — Observation Stay (HOSPITAL_COMMUNITY)
Admission: RE | Admit: 2018-08-22 | Discharge: 2018-08-23 | Disposition: A | Discharge status: Home / Self Care | Source: Ambulatory Visit | Attending: Orthopedic Surgery | Admitting: Orthopedic Surgery

## 2018-08-22 ENCOUNTER — Observation Stay (HOSPITAL_COMMUNITY)

## 2018-08-22 ENCOUNTER — Other Ambulatory Visit: Payer: Self-pay

## 2018-08-22 ENCOUNTER — Encounter (HOSPITAL_COMMUNITY): Payer: Self-pay | Admitting: Emergency Medicine

## 2018-08-22 ENCOUNTER — Encounter (HOSPITAL_COMMUNITY)
Admission: RE | Disposition: A | Discharge status: Home / Self Care | Payer: Self-pay | Source: Ambulatory Visit | Attending: Orthopedic Surgery

## 2018-08-22 DIAGNOSIS — Z96651 Presence of right artificial knee joint: Secondary | ICD-10-CM | POA: Insufficient documentation

## 2018-08-22 DIAGNOSIS — Z96652 Presence of left artificial knee joint: Secondary | ICD-10-CM

## 2018-08-22 DIAGNOSIS — M25562 Pain in left knee: Secondary | ICD-10-CM | POA: Diagnosis present

## 2018-08-22 DIAGNOSIS — F17210 Nicotine dependence, cigarettes, uncomplicated: Secondary | ICD-10-CM | POA: Insufficient documentation

## 2018-08-22 DIAGNOSIS — I10 Essential (primary) hypertension: Secondary | ICD-10-CM | POA: Insufficient documentation

## 2018-08-22 DIAGNOSIS — M25762 Osteophyte, left knee: Secondary | ICD-10-CM | POA: Insufficient documentation

## 2018-08-22 DIAGNOSIS — M1712 Unilateral primary osteoarthritis, left knee: Secondary | ICD-10-CM | POA: Diagnosis not present

## 2018-08-22 HISTORY — DX: Unilateral primary osteoarthritis, left knee: M17.12

## 2018-08-22 HISTORY — DX: Unspecified hearing loss, unspecified ear: H91.90

## 2018-08-22 HISTORY — PX: PARTIAL KNEE ARTHROPLASTY: SHX2174

## 2018-08-22 SURGERY — ARTHROPLASTY, KNEE, UNICOMPARTMENTAL
Anesthesia: Spinal | Site: Knee | Laterality: Left

## 2018-08-22 MED ORDER — CEFAZOLIN SODIUM-DEXTROSE 2-4 GM/100ML-% IV SOLN
2.0000 g | Freq: Four times a day (QID) | INTRAVENOUS | Status: AC
  Administered 2018-08-22 – 2018-08-23 (×2): 2 g via INTRAVENOUS
  Filled 2018-08-22 (×2): qty 100

## 2018-08-22 MED ORDER — ZOLPIDEM TARTRATE 5 MG PO TABS: 5.0000 mg | ORAL_TABLET | Freq: Every evening | ORAL | Status: DC | PRN

## 2018-08-22 MED ORDER — METHOCARBAMOL 500 MG PO TABS: 500.0000 mg | ORAL_TABLET | Freq: Four times a day (QID) | ORAL | Status: DC | PRN

## 2018-08-22 MED ORDER — ONDANSETRON HCL 4 MG/2ML IJ SOLN
INTRAMUSCULAR | Status: AC
  Filled 2018-08-22: qty 2

## 2018-08-22 MED ORDER — SODIUM CHLORIDE 0.9 % IR SOLN
Status: DC | PRN
  Administered 2018-08-22: 1000 mL

## 2018-08-22 MED ORDER — ACETAMINOPHEN 325 MG PO TABS: 325.0000 mg | ORAL_TABLET | Freq: Four times a day (QID) | ORAL | Status: DC | PRN

## 2018-08-22 MED ORDER — ONDANSETRON HCL 4 MG/2ML IJ SOLN: 4.0000 mg | Freq: Four times a day (QID) | INTRAMUSCULAR | Status: DC | PRN

## 2018-08-22 MED ORDER — PROPOFOL 10 MG/ML IV BOLUS
INTRAVENOUS | Status: AC
  Filled 2018-08-22: qty 60

## 2018-08-22 MED ORDER — HYDROMORPHONE HCL 1 MG/ML IJ SOLN: 0.2500 mg | INTRAMUSCULAR | Status: DC | PRN

## 2018-08-22 MED ORDER — ONDANSETRON HCL 4 MG PO TABS: 4.0000 mg | ORAL_TABLET | Freq: Three times a day (TID) | ORAL | 0 refills | Status: DC | PRN

## 2018-08-22 MED ORDER — DEXAMETHASONE SODIUM PHOSPHATE 10 MG/ML IJ SOLN: 10.0000 mg | Freq: Once | INTRAMUSCULAR | Status: DC

## 2018-08-22 MED ORDER — MIDAZOLAM HCL 2 MG/2ML IJ SOLN
1.0000 mg | INTRAMUSCULAR | Status: DC
  Administered 2018-08-22: 1 mg via INTRAVENOUS
  Filled 2018-08-22: qty 2

## 2018-08-22 MED ORDER — ONDANSETRON HCL 4 MG PO TABS: 4.0000 mg | ORAL_TABLET | Freq: Four times a day (QID) | ORAL | Status: DC | PRN

## 2018-08-22 MED ORDER — ROPIVACAINE HCL 7.5 MG/ML IJ SOLN
INTRAMUSCULAR | Status: DC | PRN
  Administered 2018-08-22: 20 mL via PERINEURAL

## 2018-08-22 MED ORDER — FENTANYL CITRATE (PF) 100 MCG/2ML IJ SOLN
50.0000 ug | INTRAMUSCULAR | Status: DC
  Administered 2018-08-22: 100 ug via INTRAVENOUS
  Filled 2018-08-22: qty 2

## 2018-08-22 MED ORDER — STERILE WATER FOR IRRIGATION IR SOLN
Status: DC | PRN
  Administered 2018-08-22: 1000 mL

## 2018-08-22 MED ORDER — KETOROLAC TROMETHAMINE 30 MG/ML IJ SOLN
INTRAMUSCULAR | Status: AC
  Filled 2018-08-22: qty 1

## 2018-08-22 MED ORDER — BUPIVACAINE HCL (PF) 0.25 % IJ SOLN
INTRAMUSCULAR | Status: AC
  Filled 2018-08-22: qty 30

## 2018-08-22 MED ORDER — TRANEXAMIC ACID-NACL 1000-0.7 MG/100ML-% IV SOLN
1000.0000 mg | Freq: Once | INTRAVENOUS | Status: AC
  Administered 2018-08-22: 1000 mg via INTRAVENOUS
  Filled 2018-08-22: qty 100

## 2018-08-22 MED ORDER — POLYETHYLENE GLYCOL 3350 17 G PO PACK: 17.0000 g | PACK | Freq: Every day | ORAL | Status: DC | PRN

## 2018-08-22 MED ORDER — ACETAMINOPHEN 500 MG PO TABS
1000.0000 mg | ORAL_TABLET | Freq: Four times a day (QID) | ORAL | Status: DC
  Administered 2018-08-22: 1000 mg via ORAL
  Filled 2018-08-22: qty 2

## 2018-08-22 MED ORDER — LACTATED RINGERS IV SOLN
INTRAVENOUS | Status: DC
  Administered 2018-08-22 (×2): via INTRAVENOUS

## 2018-08-22 MED ORDER — OXYCODONE HCL 5 MG PO TABS: 5.0000 mg | ORAL_TABLET | ORAL | 0 refills | Status: DC | PRN

## 2018-08-22 MED ORDER — POTASSIUM CHLORIDE IN NACL 20-0.45 MEQ/L-% IV SOLN
INTRAVENOUS | Status: DC
  Administered 2018-08-22: 19:00:00 via INTRAVENOUS
  Filled 2018-08-22: qty 1000

## 2018-08-22 MED ORDER — SENNA-DOCUSATE SODIUM 8.6-50 MG PO TABS: 2.0000 | ORAL_TABLET | Freq: Every day | ORAL | 1 refills | Status: DC

## 2018-08-22 MED ORDER — DEXAMETHASONE SODIUM PHOSPHATE 10 MG/ML IJ SOLN
INTRAMUSCULAR | Status: DC | PRN
  Administered 2018-08-22: 10 mg via INTRAVENOUS

## 2018-08-22 MED ORDER — MAGNESIUM CITRATE PO SOLN: 1.0000 | Freq: Once | ORAL | Status: DC | PRN

## 2018-08-22 MED ORDER — CEFAZOLIN SODIUM-DEXTROSE 2-4 GM/100ML-% IV SOLN
2.0000 g | INTRAVENOUS | Status: AC
  Administered 2018-08-22: 2 g via INTRAVENOUS
  Filled 2018-08-22: qty 100

## 2018-08-22 MED ORDER — ONDANSETRON HCL 4 MG/2ML IJ SOLN
INTRAMUSCULAR | Status: DC | PRN
  Administered 2018-08-22: 4 mg via INTRAVENOUS

## 2018-08-22 MED ORDER — ASPIRIN EC 325 MG PO TBEC: 325.0000 mg | DELAYED_RELEASE_TABLET | Freq: Two times a day (BID) | ORAL | 0 refills | Status: DC

## 2018-08-22 MED ORDER — METHOCARBAMOL 500 MG IVPB - SIMPLE MED
500.0000 mg | Freq: Four times a day (QID) | INTRAVENOUS | Status: DC | PRN
  Filled 2018-08-22: qty 50

## 2018-08-22 MED ORDER — EPHEDRINE SULFATE-NACL 50-0.9 MG/10ML-% IV SOSY
PREFILLED_SYRINGE | INTRAVENOUS | Status: DC | PRN
  Administered 2018-08-22 (×2): 5 mg via INTRAVENOUS

## 2018-08-22 MED ORDER — PHENOL 1.4 % MT LIQD
1.0000 | OROMUCOSAL | Status: DC | PRN
  Filled 2018-08-22: qty 177

## 2018-08-22 MED ORDER — MENTHOL 3 MG MT LOZG: 1.0000 | LOZENGE | OROMUCOSAL | Status: DC | PRN

## 2018-08-22 MED ORDER — OXYCODONE HCL 5 MG PO TABS: 10.0000 mg | ORAL_TABLET | ORAL | Status: DC | PRN

## 2018-08-22 MED ORDER — TRANEXAMIC ACID-NACL 1000-0.7 MG/100ML-% IV SOLN
1000.0000 mg | Freq: Once | INTRAVENOUS | Status: AC
  Administered 2018-08-22: 1000 mg via INTRAVENOUS
  Filled 2018-08-22 (×2): qty 100

## 2018-08-22 MED ORDER — METOCLOPRAMIDE HCL 5 MG/ML IJ SOLN: 5.0000 mg | Freq: Three times a day (TID) | INTRAMUSCULAR | Status: DC | PRN

## 2018-08-22 MED ORDER — CLONIDINE HCL (ANALGESIA) 100 MCG/ML EP SOLN
EPIDURAL | Status: DC | PRN
  Administered 2018-08-22: 70 ug

## 2018-08-22 MED ORDER — DEXAMETHASONE SODIUM PHOSPHATE 10 MG/ML IJ SOLN: INTRAMUSCULAR | Status: DC | PRN

## 2018-08-22 MED ORDER — BUPIVACAINE IN DEXTROSE 0.75-8.25 % IT SOLN
INTRATHECAL | Status: DC | PRN
  Administered 2018-08-22: 1.8 mL via INTRATHECAL

## 2018-08-22 MED ORDER — ASPIRIN EC 325 MG PO TBEC
325.0000 mg | DELAYED_RELEASE_TABLET | Freq: Two times a day (BID) | ORAL | Status: DC
  Administered 2018-08-22 – 2018-08-23 (×2): 325 mg via ORAL
  Filled 2018-08-22 (×2): qty 1

## 2018-08-22 MED ORDER — METOCLOPRAMIDE HCL 5 MG PO TABS: 5.0000 mg | ORAL_TABLET | Freq: Three times a day (TID) | ORAL | Status: DC | PRN

## 2018-08-22 MED ORDER — DOCUSATE SODIUM 100 MG PO CAPS
100.0000 mg | ORAL_CAPSULE | Freq: Two times a day (BID) | ORAL | Status: DC
  Administered 2018-08-22 – 2018-08-23 (×2): 100 mg via ORAL
  Filled 2018-08-22 (×2): qty 1

## 2018-08-22 MED ORDER — PROPOFOL 500 MG/50ML IV EMUL
INTRAVENOUS | Status: DC | PRN
  Administered 2018-08-22: 75 ug/kg/min via INTRAVENOUS

## 2018-08-22 MED ORDER — PROMETHAZINE HCL 25 MG/ML IJ SOLN: 6.2500 mg | INTRAMUSCULAR | Status: DC | PRN

## 2018-08-22 MED ORDER — OXYCODONE HCL 5 MG PO TABS: 5.0000 mg | ORAL_TABLET | ORAL | Status: DC | PRN

## 2018-08-22 MED ORDER — BACLOFEN 10 MG PO TABS: 10.0000 mg | ORAL_TABLET | Freq: Three times a day (TID) | ORAL | 0 refills | Status: DC

## 2018-08-22 MED ORDER — PROPOFOL 10 MG/ML IV BOLUS
INTRAVENOUS | Status: DC | PRN
  Administered 2018-08-22 (×2): 20 mg via INTRAVENOUS

## 2018-08-22 MED ORDER — PROPOFOL 500 MG/50ML IV EMUL: INTRAVENOUS | Status: DC | PRN

## 2018-08-22 MED ORDER — ALUM & MAG HYDROXIDE-SIMETH 200-200-20 MG/5ML PO SUSP: 30.0000 mL | ORAL | Status: DC | PRN

## 2018-08-22 MED ORDER — DEXAMETHASONE SODIUM PHOSPHATE 10 MG/ML IJ SOLN
INTRAMUSCULAR | Status: AC
  Filled 2018-08-22: qty 1

## 2018-08-22 MED ORDER — 0.9 % SODIUM CHLORIDE (POUR BTL) OPTIME
TOPICAL | Status: DC | PRN
  Administered 2018-08-22: 500 mL

## 2018-08-22 MED ORDER — EPHEDRINE 5 MG/ML INJ
INTRAVENOUS | Status: AC
  Filled 2018-08-22: qty 10

## 2018-08-22 MED ORDER — HYDROMORPHONE HCL 1 MG/ML IJ SOLN: 0.5000 mg | INTRAMUSCULAR | Status: DC | PRN

## 2018-08-22 MED ORDER — CHLORHEXIDINE GLUCONATE 4 % EX LIQD: 60.0000 mL | Freq: Once | CUTANEOUS | Status: DC

## 2018-08-22 MED ORDER — KETOROLAC TROMETHAMINE 30 MG/ML IJ SOLN
INTRAMUSCULAR | Status: DC | PRN
  Administered 2018-08-22: 30 mg

## 2018-08-22 MED ORDER — BUPIVACAINE HCL 0.25 % IJ SOLN
INTRAMUSCULAR | Status: DC | PRN
  Administered 2018-08-22: 30 mL

## 2018-08-22 MED ORDER — BISACODYL 10 MG RE SUPP: 10.0000 mg | Freq: Every day | RECTAL | Status: DC | PRN

## 2018-08-22 MED ORDER — DIPHENHYDRAMINE HCL 12.5 MG/5ML PO ELIX: 12.5000 mg | ORAL_SOLUTION | ORAL | Status: DC | PRN

## 2018-08-22 SURGICAL SUPPLY — 72 items
BAG SPEC THK2 15X12 ZIP CLS (MISCELLANEOUS) ×1
BAG ZIPLOCK 12X15 (MISCELLANEOUS) ×3 IMPLANT
BANDAGE ESMARK 6X9 LF (GAUZE/BANDAGES/DRESSINGS) ×1 IMPLANT
BEARING MENISCAL TIBIAL 4 SM L (Orthopedic Implant) ×2 IMPLANT
BLADE SURG 15 STRL LF DISP TIS (BLADE) ×1 IMPLANT
BLADE SURG 15 STRL SS (BLADE) ×3
BNDG CMPR 9X6 STRL LF SNTH (GAUZE/BANDAGES/DRESSINGS) ×1
BNDG CMPR MED 15X6 ELC VLCR LF (GAUZE/BANDAGES/DRESSINGS) ×1
BNDG ELASTIC 6X15 VLCR STRL LF (GAUZE/BANDAGES/DRESSINGS) ×2 IMPLANT
BNDG ESMARK 6X9 LF (GAUZE/BANDAGES/DRESSINGS) ×3
BOWL SMART MIX CTS (DISPOSABLE) ×3 IMPLANT
BRNG TIB SM 4 PHS 3 LT MEN (Orthopedic Implant) ×1 IMPLANT
CEMENT BONE R 1X40 (Cement) ×3 IMPLANT
CLOSURE STERI-STRIP 1/2X4 (GAUZE/BANDAGES/DRESSINGS) ×1
CLSR STERI-STRIP ANTIMIC 1/2X4 (GAUZE/BANDAGES/DRESSINGS) ×2 IMPLANT
COVER SURGICAL LIGHT HANDLE (MISCELLANEOUS) ×3 IMPLANT
COVER WAND RF STERILE (DRAPES) IMPLANT
CUFF TOURN SGL QUICK 34 (TOURNIQUET CUFF) ×3
CUFF TRNQT CYL 34X4X40X1 (TOURNIQUET CUFF) ×1 IMPLANT
DECANTER SPIKE VIAL GLASS SM (MISCELLANEOUS) ×2 IMPLANT
DRAPE EXTREMITY T 121X128X90 (DISPOSABLE) ×3 IMPLANT
DRAPE POUCH INSTRU U-SHP 10X18 (DRAPES) ×3 IMPLANT
DRAPE U-SHAPE 47X51 STRL (DRAPES) ×3 IMPLANT
DRSG MEPILEX BORDER 4X8 (GAUZE/BANDAGES/DRESSINGS) ×3 IMPLANT
DURAPREP 26ML APPLICATOR (WOUND CARE) ×6 IMPLANT
ELECT REM PT RETURN 15FT ADLT (MISCELLANEOUS) ×3 IMPLANT
FACESHIELD WRAPAROUND (MASK) ×3 IMPLANT
FACESHIELD WRAPAROUND OR TEAM (MASK) ×1 IMPLANT
GLOVE BIO SURGEON STRL SZ 6.5 (GLOVE) ×1 IMPLANT
GLOVE BIO SURGEONS STRL SZ 6.5 (GLOVE) ×1
GLOVE BIOGEL PI IND STRL 6.5 (GLOVE) IMPLANT
GLOVE BIOGEL PI IND STRL 7.5 (GLOVE) IMPLANT
GLOVE BIOGEL PI IND STRL 8 (GLOVE) ×2 IMPLANT
GLOVE BIOGEL PI INDICATOR 6.5 (GLOVE) ×2
GLOVE BIOGEL PI INDICATOR 7.5 (GLOVE) ×6
GLOVE BIOGEL PI INDICATOR 8 (GLOVE) ×4
GLOVE ORTHO TXT STRL SZ7.5 (GLOVE) ×3 IMPLANT
GLOVE SURG ORTHO 8.0 STRL STRW (GLOVE) ×3 IMPLANT
GLOVE SURG SS PI 7.0 STRL IVOR (GLOVE) ×2 IMPLANT
GOWN STRL REIN 3XL LVL4 (GOWN DISPOSABLE) ×2 IMPLANT
GOWN STRL REUS W/TWL 2XL LVL3 (GOWN DISPOSABLE) ×3 IMPLANT
GOWN STRL REUS W/TWL LRG LVL3 (GOWN DISPOSABLE) ×5 IMPLANT
HANDPIECE INTERPULSE COAX TIP (DISPOSABLE) ×3
HOLDER FOLEY CATH W/STRAP (MISCELLANEOUS) ×2 IMPLANT
HOOD PEEL AWAY FLYTE STAYCOOL (MISCELLANEOUS) ×6 IMPLANT
IMMOBILIZER KNEE 20 (SOFTGOODS) ×3
IMMOBILIZER KNEE 20 THIGH 36 (SOFTGOODS) IMPLANT
IMMOBILIZER KNEE 22 UNIV (SOFTGOODS) ×3 IMPLANT
KIT BASIN OR (CUSTOM PROCEDURE TRAY) ×3 IMPLANT
NDL SAFETY ECLIPSE 18X1.5 (NEEDLE) ×1 IMPLANT
NEEDLE HYPO 18GX1.5 SHARP (NEEDLE) ×3
PACK BLADE SAW RECIP 70 3 PT (BLADE) ×2 IMPLANT
PACK ICE MAXI GEL EZY WRAP (MISCELLANEOUS) ×3 IMPLANT
PACK TOTAL JOINT (CUSTOM PROCEDURE TRAY) ×3 IMPLANT
PAD ABD 8X10 STRL (GAUZE/BANDAGES/DRESSINGS) ×2 IMPLANT
PEG FEMORAL PEGGED STRL SM (Knees) ×2 IMPLANT
PROTECTOR NERVE ULNAR (MISCELLANEOUS) ×3 IMPLANT
SET HNDPC FAN SPRY TIP SCT (DISPOSABLE) ×1 IMPLANT
SUCTION FRAZIER HANDLE 12FR (TUBING) ×2
SUCTION TUBE FRAZIER 12FR DISP (TUBING) ×1 IMPLANT
SUT VIC AB 0 CT1 36 (SUTURE) ×3 IMPLANT
SUT VIC AB 2-0 CT1 27 (SUTURE) ×3
SUT VIC AB 2-0 CT1 TAPERPNT 27 (SUTURE) ×1 IMPLANT
SUT VIC AB 3-0 SH 8-18 (SUTURE) ×3 IMPLANT
SYR 20CC LL (SYRINGE) ×3 IMPLANT
SYR 30ML LL (SYRINGE) ×3 IMPLANT
SYR 3ML LL SCALE MARK (SYRINGE) ×3 IMPLANT
TOWEL OR 17X26 10 PK STRL BLUE (TOWEL DISPOSABLE) ×3 IMPLANT
TOWEL OR NON WOVEN STRL DISP B (DISPOSABLE) ×3 IMPLANT
TRAY FOLEY MTR SLVR 14FR STAT (SET/KITS/TRAYS/PACK) ×2 IMPLANT
TRAY TIBIAL KNEE OXFORD STD AA (Joint) ×2 IMPLANT
WRAP KNEE MAXI GEL POST OP (GAUZE/BANDAGES/DRESSINGS) ×2 IMPLANT

## 2018-08-22 NOTE — Op Note (Signed)
08/22/2018  3:16 PM  PATIENT:  Betty Carrillo    PRE-OPERATIVE DIAGNOSIS:  Left knee primary localized osteoarthritis  POST-OPERATIVE DIAGNOSIS:  Same  PROCEDURE:  Unicompartmental Knee Arthroplasty  SURGEON:  Eulas PostJoshua P Emerick Weatherly, MD  PHYSICIAN ASSISTANT: Janace LittenBrandon Parry, OPA-C, present and scrubbed throughout the case, critical for completion in a timely fashion, and for retraction, instrumentation, and closure.  ANESTHESIA:   Spinal with adductor canal block and intraarticular marcaine and toradol  ESTIMATED BLOOD LOSS: 100 ml  UNIQUE ASPECTS OF THE CASE:  She was very small on the tibia.    PREOPERATIVE INDICATIONS:  Betty Carrillo is a  63 y.o. female with a diagnosis of djd left knee who failed conservative measures and elected for surgical management.    The risks benefits and alternatives were discussed with the patient preoperatively including but not limited to the risks of infection, bleeding, nerve injury, cardiopulmonary complications, blood clots, the need for revision surgery, among others, and the patient was willing to proceed.  OPERATIVE IMPLANTS: Biomet Oxford mobile bearing medial compartment arthroplasty femur size small, tibia size AA, bearing size 4.  OPERATIVE FINDINGS: Extensive grade 3 loss on the femur and tibial medially.  Laterally was grade 1 on the femur only.  Patella and patellofemoral joint was normal.  Had to mill twice, first with a 4 and then a 6. The ACL was intact.  OPERATIVE PROCEDURE: The patient was brought to the operating room placed in the supine position. Spinal anesthesia was administered. IV antibiotics were given. The lower extremity was placed in the legholder and prepped and draped in usual sterile fashion.  Time out was performed.  The leg was elevated and exsanguinated and the tourniquet was inflated. Anteromedial incision was performed, and I took care to preserve the MCL. Parapatellar incision was carried out, and the osteophytes were  excised, along with the medial meniscus and a small portion of the fat pad.  The extra medullary tibial cutting jig was applied, using the spoon and the 4mm G-Clamp and the 2 mm shim, and I took care to protect the anterior cruciate ligament insertion and the tibial spine. The medial collateral ligament was also protected, and I resected my proximal tibia, matching the anatomic slope.   The proximal tibial bony cut was removed in one piece, and I turned my attention to the femur.  The intramedullary femoral rod was placed using the drill, and then using the appropriate reference, I assembled the femoral jig, setting my posterior cutting block. I resected my posterior femur, used the 0 spigot for the anterior femur, and then measured my gap.   I then used the appropriate mill to match the extension gap to the flexion gap. The second milling was at a 4 and then again at 6.  The gaps were then measured again with the appropriate feeler gauges. Once I had balanced flexion and extension gaps, I then completed the preparation of the femur.  I milled off the anterior aspect of the distal femur to prevent impingement. I also exposed the tibia, and selected the above-named component, and then used the cutting jig to prepare the keel slot on the tibia. I also used the awl to curette out the bone to complete the preparation of the keel. The back wall was intact.  I then placed trial components, and it was found to have excellent motion, and appropriate balance.  I then cemented the components into place, cementing the tibia first, removing all excess cement, and then cementing the  femur.  All loose cement was removed.  The real polyethylene insert was applied manually, and the knee was taken through functional range of motion, and found to have excellent stability and restoration of joint motion, with excellent balance.  The wounds were irrigated copiously, and the parapatellar tissue closed with Vicryl,  followed by Vicryl for the subcutaneous tissue, with routine closure with Steri-Strips and sterile gauze.  The tourniquet was released, and the patient was awakened and extubated and returned to PACU in stable and satisfactory condition. There were no complications.

## 2018-08-22 NOTE — Anesthesia Procedure Notes (Signed)
Anesthesia Regional Block: Adductor canal block   Pre-Anesthetic Checklist: ,, timeout performed, Correct Patient, Correct Site, Correct Laterality, Correct Procedure, Correct Position, site marked, Risks and benefits discussed,  Surgical consent,  Pre-op evaluation,  At surgeon's request and post-op pain management  Laterality: Left  Prep: chloraprep       Needles:  Injection technique: Single-shot  Needle Type: Stimiplex     Needle Length: 9cm      Additional Needles:   Procedures:,,,, ultrasound used (permanent image in chart),,,,  Narrative:  Start time: 08/22/2018 12:49 PM End time: 08/22/2018 12:56 PM Injection made incrementally with aspirations every 5 mL.  Performed by: Personally  Anesthesiologist: Eilene Ghazi, MD  Additional Notes: Patient tolerated the procedure well without complications

## 2018-08-22 NOTE — Anesthesia Procedure Notes (Signed)
Anesthesia Procedure Image    

## 2018-08-22 NOTE — Discharge Instructions (Signed)

## 2018-08-22 NOTE — Anesthesia Preprocedure Evaluation (Signed)
Anesthesia Evaluation  Patient identified by MRN, date of birth, ID band Patient awake    Reviewed: Allergy & Precautions, NPO status , Patient's Chart, lab work & pertinent test results  Airway Mallampati: II  TM Distance: >3 FB Neck ROM: Full    Dental no notable dental hx.    Pulmonary Current Smoker,    Pulmonary exam normal breath sounds clear to auscultation       Cardiovascular hypertension, Normal cardiovascular exam Rhythm:Regular Rate:Normal     Neuro/Psych negative neurological ROS  negative psych ROS   GI/Hepatic negative GI ROS, Neg liver ROS,   Endo/Other  negative endocrine ROS  Renal/GU negative Renal ROS  negative genitourinary   Musculoskeletal  (+) Arthritis , Osteoarthritis,    Abdominal   Peds negative pediatric ROS (+)  Hematology negative hematology ROS (+)   Anesthesia Other Findings   Reproductive/Obstetrics negative OB ROS                             Anesthesia Physical Anesthesia Plan  ASA: II  Anesthesia Plan: Spinal   Post-op Pain Management:  Regional for Post-op pain   Induction: Intravenous  PONV Risk Score and Plan: 2 and Ondansetron, Dexamethasone and Treatment may vary due to age or medical condition  Airway Management Planned: Simple Face Mask  Additional Equipment:   Intra-op Plan:   Post-operative Plan:   Informed Consent: I have reviewed the patients History and Physical, chart, labs and discussed the procedure including the risks, benefits and alternatives for the proposed anesthesia with the patient or authorized representative who has indicated his/her understanding and acceptance.     Dental advisory given  Plan Discussed with: CRNA and Surgeon  Anesthesia Plan Comments:         Anesthesia Quick Evaluation  

## 2018-08-22 NOTE — Progress Notes (Signed)
Assisted Dr. Rose with left, ultrasound guided, adductor canal block. Side rails up, monitors on throughout procedure. See vital signs in flow sheet. Tolerated Procedure well.  

## 2018-08-22 NOTE — H&P (Signed)
PREOPERATIVE H&P  Chief Complaint: Left knee pain  HPI: Betty Carrillo is a 63 y.o. female who presents for preoperative history and physical with a diagnosis of left knee anteromedial osteoarthritis. Symptoms are rated as moderate to severe, and have been worsening.  This is significantly impairing activities of daily living.  She has elected for surgical management.   She has failed injections, activity modification, anti-inflammatories, and assistive devices.  Preoperative X-rays demonstrate end stage degenerative changes with osteophyte formation, loss of joint space, subchondral sclerosis.  She has had the other side done, and is very happy with the results, and wishes for the same procedure.  Past Medical History:  Diagnosis Date  . Anal fissure   . Arthritis   . Colon polyp   . Depression   . Fibromyalgia   . Hypertension    was dx some time ago , was placed on medication then stopped to manage with diet and exercise ,    Past Surgical History:  Procedure Laterality Date  . CARPAL TUNNEL RELEASE Bilateral   . COLONOSCOPY W/ POLYPECTOMY    . PARTIAL KNEE ARTHROPLASTY Right   . TUBAL LIGATION     Social History   Socioeconomic History  . Marital status: Married    Spouse name: Not on file  . Number of children: 2  . Years of education: Not on file  . Highest education level: Not on file  Occupational History    Employer: [employer]  Social Needs  . Financial resource strain: Not on file  . Food insecurity:    Worry: Not on file    Inability: Not on file  . Transportation needs:    Medical: Not on file    Non-medical: Not on file  Tobacco Use  . Smoking status: Current Every Day Smoker  . Smokeless tobacco: Never Used  . Tobacco comment: stopped for surgery ; last  cigarette  08-15-2018  Substance and Sexual Activity  . Alcohol use: No  . Drug use: No  . Sexual activity: Not on file  Lifestyle  . Physical activity:    Days per week: Not on file    Minutes per  session: Not on file  . Stress: Not on file  Relationships  . Social connections:    Talks on phone: Not on file    Gets together: Not on file    Attends religious service: Not on file    Active member of club or organization: Not on file    Attends meetings of clubs or organizations: Not on file    Relationship status: Not on file  Other Topics Concern  . Not on file  Social History Narrative  . Not on file   Family History  Problem Relation Age of Onset  . Bone cancer Father   . Liver disease Father   . Diabetes Mother   . Colitis Mother   . Heart disease Mother   . Colitis Sister   . Colon cancer Neg Hx    Allergies  Allergen Reactions  . Sulfonamide Derivatives Rash   Prior to Admission medications   Not on File     Positive ROS: All other systems have been reviewed and were otherwise negative with the exception of those mentioned in the HPI and as above.  Physical Exam: General: Alert, no acute distress Cardiovascular: No pedal edema Respiratory: No cyanosis, no use of accessory musculature GI: No organomegaly, abdomen is soft and non-tender Skin: No lesions in the area of chief complaint  Neurologic: Sensation intact distally Psychiatric: Patient is competent for consent with normal mood and affect Lymphatic: No axillary or cervical lymphadenopathy  MUSCULOSKELETAL: Left knee with 0 to 115 degrees of motion with positive crepitance and varus alignment with pseudolaxity.  Assessment: Left anteromedial knee osteoarthritis   Plan: Plan for Procedure(s): UNICOMPARTMENTAL KNEE  The risks benefits and alternatives were discussed with the patient including but not limited to the risks of nonoperative treatment, versus surgical intervention including infection, bleeding, nerve injury,  blood clots, cardiopulmonary complications, morbidity, mortality, among others, and they were willing to proceed.    Patient's anticipated LOS is less than 2 midnights, meeting  these requirements: - Younger than 8365 - Lives within 1 hour of care - Has a competent adult at home to recover with post-op recover - NO history of  - Chronic pain requiring opiods  - Diabetes  - Coronary Artery Disease  - Heart failure  - Heart attack  - Stroke  - DVT/VTE  - Cardiac arrhythmia  - Respiratory Failure/COPD  - Renal failure  - Anemia  - Advanced Liver disease        Eulas PostJoshua P Jahmeek Shirk, MD Cell [phone]   08/22/2018 12:52 PM

## 2018-08-22 NOTE — Anesthesia Procedure Notes (Signed)
Date/Time: 08/22/2018 1:50 PM Performed by: Florene Route, CRNA Oxygen Delivery Method: Simple face mask

## 2018-08-22 NOTE — Transfer of Care (Signed)
Immediate Anesthesia Transfer of Care Note  Patient: Betty Carrillo  Procedure(s) Performed: UNICOMPARTMENTAL KNEE (Left Knee)  Patient Location: PACU  Anesthesia Type:Spinal  Level of Consciousness: awake and alert   Airway & Oxygen Therapy: Patient Spontanous Breathing and Patient connected to face mask oxygen  Post-op Assessment: Report given to RN and Post -op Vital signs reviewed and stable  Post vital signs: Reviewed and stable  Last Vitals:  Vitals Value Taken Time  BP 131/67 08/22/2018  3:49 PM  Temp    Pulse 61 08/22/2018  3:51 PM  Resp 15 08/22/2018  3:51 PM  SpO2 100 % 08/22/2018  3:51 PM  Vitals shown include unvalidated device data.  Last Pain:  Vitals:   08/22/18 1116  TempSrc:   PainSc: 5       Patients Stated Pain Goal: 4 (08/22/18 1116)  Complications: No apparent anesthesia complications

## 2018-08-22 NOTE — Anesthesia Procedure Notes (Signed)
Spinal  Patient location during procedure: OR Staffing Resident/CRNA: ,  L, CRNA Performed: resident/CRNA  Preanesthetic Checklist Completed: patient identified, site marked, surgical consent, pre-op evaluation, timeout performed, IV checked, risks and benefits discussed and monitors and equipment checked Spinal Block Patient position: sitting Prep: DuraPrep Patient monitoring: heart rate, continuous pulse ox and blood pressure Approach: midline Location: L4-5 Injection technique: single-shot Needle Needle type: Sprotte  Needle gauge: 24 G Needle length: 9 cm Additional Notes Kit expiration date 04/18/2019 and lot # [id number] Clear free flow of CSF, negative heme, negative paresthesia Tolerated well and returned to supine position     

## 2018-08-23 ENCOUNTER — Encounter (HOSPITAL_COMMUNITY): Payer: Self-pay | Admitting: Orthopedic Surgery

## 2018-08-23 DIAGNOSIS — M1712 Unilateral primary osteoarthritis, left knee: Secondary | ICD-10-CM | POA: Diagnosis not present

## 2018-08-23 LAB — BASIC METABOLIC PANEL
ANION GAP: 8 (ref 5–15)
BUN: 19 mg/dL (ref 8–23)
CO2: 23 mmol/L (ref 22–32)
Calcium: 8.7 mg/dL — ABNORMAL LOW (ref 8.9–10.3)
Chloride: 108 mmol/L (ref 98–111)
Creatinine, Ser: 0.85 mg/dL (ref 0.44–1.00)
GFR calc Af Amer: >60 mL/min (ref >60)
GFR calc non Af Amer: >60 mL/min (ref >60)
Glucose, Bld: 170 mg/dL — ABNORMAL HIGH (ref 70–99)
Potassium: 4.1 mmol/L (ref 3.5–5.1)
Sodium: 139 mmol/L (ref 135–145)

## 2018-08-23 LAB — CBC
HCT: 45.3 % (ref 36.0–46.0)
Hemoglobin: 14.4 g/dL (ref 12.0–15.0)
MCH: 29 pg (ref 26.0–34.0)
MCHC: 31.8 g/dL (ref 30.0–36.0)
MCV: 91.1 fL (ref 80.0–100.0)
Platelets: 232 10*3/uL (ref 150–400)
RBC: 4.97 MIL/uL (ref 3.87–5.11)
RDW: 12.1 % (ref 11.5–15.5)
WBC: 13.1 10*3/uL — ABNORMAL HIGH (ref 4.0–10.5)
nRBC: 0 % (ref 0.0–0.2)

## 2018-08-23 NOTE — Discharge Summary (Signed)
Physician Discharge Summary  Patient ID: Betty Carrillo MRN: [id number] DOB/AGE: 64/02/1956 64 y.o.  Admit date: 08/22/2018 Discharge date: 08/23/2018  Admission Diagnoses:  Primary localized osteoarthritis of left knee  Discharge Diagnoses:  Principal Problem:   Primary localized osteoarthritis of left knee Active Problems:   S/P left unicompartmental knee replacement   Past Medical History:  Diagnosis Date  . Anal fissure   . Arthritis   . Colon polyp   . Depression   . Fibromyalgia   . Hearing deficit   . Hypertension    was dx some time ago , was placed on medication then stopped to manage with diet and exercise ,   . Primary localized osteoarthritis of left knee 08/22/2018    Surgeries: Procedure(s): UNICOMPARTMENTAL KNEE on 08/22/2018   Consultants (if any):   Discharged Condition: Improved  Hospital Course: Betty Carrillo is an 63 y.o. female who was admitted 08/22/2018 with a diagnosis of Primary localized osteoarthritis of left knee and went to the operating room on 08/22/2018 and underwent the above named procedures.    She was given perioperative antibiotics:  Anti-infectives (From admission, onward)   Start     Dose/Rate Route Frequency Ordered Stop   08/22/18 2000  ceFAZolin (ANCEF) IVPB 2g/100 mL premix     2 g 200 mL/hr over 30 Minutes Intravenous Every 6 hours 08/22/18 1749 08/23/18 0143   08/22/18 1100  ceFAZolin (ANCEF) IVPB 2g/100 mL premix     2 g 200 mL/hr over 30 Minutes Intravenous On call to O.R. 08/22/18 1056 08/22/18 1400    .  She was given sequential compression devices, early ambulation, and aspirin for DVT prophylaxis.  She benefited maximally from the hospital stay and there were no complications.    Recent vital signs:  Vitals:   08/23/18 0051 08/23/18 0449  BP: (!) 100/49 (!) 117/59  Pulse: (!) 57 (!) 55  Resp: 17 16  Temp: 98 F (36.7 C) 98.4 F (36.9 C)  SpO2: 94% 97%    Recent laboratory studies:  Lab Results  Component Value  Date   HGB 14.4 08/23/2018   HGB 15.5 (H) 08/17/2018   HGB 15.6 (H) 03/29/2011   Lab Results  Component Value Date   WBC 13.1 (H) 08/23/2018   PLT 232 08/23/2018   No results found for: INR Lab Results  Component Value Date   NA 139 08/23/2018   K 4.1 08/23/2018   CL 108 08/23/2018   CO2 23 08/23/2018   BUN 19 08/23/2018   CREATININE 0.85 08/23/2018   GLUCOSE 170 (H) 08/23/2018    Discharge Medications:   Allergies as of 08/23/2018      Reactions   Sulfonamide Derivatives Rash      Medication List    TAKE these medications   aspirin EC 325 MG tablet Take 1 tablet (325 mg total) by mouth 2 (two) times daily.   baclofen 10 MG tablet Commonly known as:  LIORESAL Take 1 tablet (10 mg total) by mouth 3 (three) times daily. As needed for muscle spasm   ondansetron 4 MG tablet Commonly known as:  ZOFRAN Take 1 tablet (4 mg total) by mouth every 8 (eight) hours as needed for nausea or vomiting.   oxyCODONE 5 MG immediate release tablet Commonly known as:  ROXICODONE Take 1 tablet (5 mg total) by mouth every 4 (four) hours as needed for severe pain.   sennosides-docusate sodium 8.6-50 MG tablet Commonly known as:  SENOKOT-S Take 2 tablets by mouth daily.  Diagnostic Studies: Dg Knee Left Port  Result Date: 08/22/2018 CLINICAL DATA:  Left partial knee replacement. EXAM: PORTABLE LEFT KNEE - 1-2 VIEW COMPARISON:  07/01/2016. FINDINGS: Medial compartment knee replacement noted medially. Hardware intact. Anatomic alignment. Diffuse degenerative change. Loose bodies noted. No acute bony abnormality identified. No evidence of fracture. IMPRESSION: Medial compartment left knee replacement. Hardware intact. Anatomic alignment. Electronically Signed   By: Maisie Fus  Register   On: 08/22/2018 16:13    Disposition: Discharge disposition: 01-Home or Self Care         Follow-up Information    Teryl Lucy, MD. Schedule an appointment as soon as possible for a visit in 2  weeks.   Specialty:  Orthopedic Surgery Contact information: [address] [place] [address] [phone]            Signed: Eulas Post 08/23/2018, 9:16 AM

## 2018-08-23 NOTE — Anesthesia Postprocedure Evaluation (Signed)
Anesthesia Post Note  Patient: Betty Carrillo  Procedure(s) Performed: UNICOMPARTMENTAL KNEE (Left Knee)     Patient location during evaluation: PACU Anesthesia Type: Spinal Level of consciousness: oriented and awake and alert Pain management: pain level controlled Vital Signs Assessment: post-procedure vital signs reviewed and stable Respiratory status: spontaneous breathing, respiratory function stable and patient connected to nasal cannula oxygen Cardiovascular status: blood pressure returned to baseline and stable Postop Assessment: no headache, no backache and no apparent nausea or vomiting Anesthetic complications: no    Last Vitals:  Vitals:   08/23/18 0051 08/23/18 0449  BP: (!) 100/49 (!) 117/59  Pulse: (!) 57 (!) 55  Resp: 17 16  Temp: 36.7 C 36.9 C  SpO2: 94% 97%    Last Pain:  Vitals:   08/23/18 0449  TempSrc: Oral  PainSc:                  [name]

## 2018-08-23 NOTE — Plan of Care (Signed)
Patient discharged home in stable condition 

## 2018-08-23 NOTE — Evaluation (Signed)
Physical Therapy Evaluation Patient Details Name: Betty Carrillo MRN: [id number] DOB: 02-25-1956 Today's Date: 08/23/2018   History of Present Illness  Pt s/p L UKR and with hx of R UKR  Clinical Impression  Pt s/p L UKR and presents with decreased L LE strength/ROM and post op discomfort limiting functional mobility.  Pt currently mobilizing at sup/min guard level and eager for dc home.  Pt has OP PT appt for tomorrow.    Follow Up Recommendations Follow surgeon's recommendation for DC plan and follow-up therapies    Equipment Recommendations  None recommended by PT    Recommendations for Other Services       Precautions / Restrictions Precautions Precautions: Fall;Knee Restrictions Weight Bearing Restrictions: No      Mobility  Bed Mobility Overal bed mobility: Needs Assistance Bed Mobility: Supine to Sit     Supine to sit: Min guard     General bed mobility comments: cues for sequence and use of R LE to self assist  Transfers Overall transfer level: Needs assistance Equipment used: Rolling walker (2 wheeled) Transfers: Sit to/from Stand Sit to Stand: Min guard;Supervision         General transfer comment: cues for LE management and use of UEs to self assist  Ambulation/Gait Ambulation/Gait assistance: Min guard;Supervision Gait Distance (Feet): 140 Feet Assistive device: Rolling walker (2 wheeled) Gait Pattern/deviations: Step-to pattern;Step-through pattern;Decreased step length - right;Decreased step length - left;Shuffle;Trunk flexed Gait velocity: decr   General Gait Details: cues for posture, position from RW and initial sequence  Stairs Stairs: Yes Stairs assistance: Min assist Stair Management: Two rails;Step to pattern;Forwards Number of Stairs: 2 General stair comments: min cues for sequence  Wheelchair Mobility    Modified Rankin (Stroke Patients Only)       Balance Overall balance assessment: Mild deficits observed, not formally  tested                                           Pertinent Vitals/Pain Pain Assessment: 0-10 Pain Score: 2  Pain Location: L knee Pain Descriptors / Indicators: Aching;Sore Pain Intervention(s): Limited activity within patient's tolerance;Monitored during session;Premedicated before session;Ice applied    Home Living Family/patient expects to be discharged to:: Private residence Living Arrangements: Spouse/significant other Available Help at Discharge: Family Type of Home: House Home Access: Stairs to enter Entrance Stairs-Rails: Right;Left;Can reach both Secretary/administrator of Steps: 3 Home Layout: One level Home Equipment: Environmental consultant - 2 wheels      Prior Function Level of Independence: Independent               Hand Dominance        Extremity/Trunk Assessment   Upper Extremity Assessment Upper Extremity Assessment: Overall WFL for tasks assessed    Lower Extremity Assessment Lower Extremity Assessment: LLE deficits/detail LLE Deficits / Details: 3/5 quads with IND SLR; AAROM at knee -5 - 90     Cervical / Trunk Assessment Cervical / Trunk Assessment: Normal  Communication   Communication: No difficulties  Cognition Arousal/Alertness: Awake/alert Behavior During Therapy: WFL for tasks assessed/performed Overall Cognitive Status: Within Functional Limits for tasks assessed                                        General Comments  Exercises Total Joint Exercises Ankle Circles/Pumps: AROM;Both;20 reps;Supine Quad Sets: AROM;Both;10 reps;Supine Heel Slides: AAROM;Left;15 reps;Supine Straight Leg Raises: AAROM;AROM;Left;15 reps;Supine   Assessment/Plan    PT Assessment Patient needs continued PT services  PT Problem List Decreased strength;Decreased range of motion;Decreased activity tolerance;Decreased mobility;Decreased knowledge of use of DME;Pain       PT Treatment Interventions DME instruction;Gait  training;Stair training;Functional mobility training;Therapeutic activities;Therapeutic exercise;Patient/family education    PT Goals (Current goals can be found in the Care Plan section)  Acute Rehab PT Goals Patient Stated Goal: Regain IND PT Goal Formulation: All assessment and education complete, DC therapy    Frequency 7X/week   Barriers to discharge        Co-evaluation               AM-PAC PT "6 Clicks" Mobility  Outcome Measure Help needed turning from your back to your side while in a flat bed without using bedrails?: A Little Help needed moving from lying on your back to sitting on the side of a flat bed without using bedrails?: A Little Help needed moving to and from a bed to a chair (including a wheelchair)?: A Little Help needed standing up from a chair using your arms (e.g., wheelchair or bedside chair)?: A Little Help needed to walk in hospital room?: A Little Help needed climbing 3-5 steps with a railing? : A Little 6 Click Score: 18    End of Session Equipment Utilized During Treatment: Gait belt Activity Tolerance: Patient tolerated treatment well Patient left: in chair;with call bell/phone within reach;with chair alarm set Nurse Communication: Mobility status PT Visit Diagnosis: Difficulty in walking, not elsewhere classified (R26.2)    Time: [id number] PT Time Calculation (min) (ACUTE ONLY): 31 min   Charges:   PT Evaluation $PT Eval Low Complexity: 1 Low PT Treatments $Therapeutic Exercise: 8-22 mins        Mauro KaufmannHunter Sheron Robin PT Acute Rehabilitation Services Pager [phone] Office [phone]   Jakim Drapeau 08/23/2018, 11:22 AM

## 2018-09-25 ENCOUNTER — Other Ambulatory Visit: Payer: Self-pay | Admitting: Family Medicine

## 2018-09-25 ENCOUNTER — Ambulatory Visit
Admission: RE | Admit: 2018-09-25 | Discharge: 2018-09-25 | Disposition: A | Discharge status: Home / Self Care | Source: Ambulatory Visit | Attending: Family Medicine | Admitting: Family Medicine

## 2018-09-25 DIAGNOSIS — T1490XA Injury, unspecified, initial encounter: Secondary | ICD-10-CM

## 2019-07-04 ENCOUNTER — Other Ambulatory Visit: Payer: Self-pay

## 2019-07-04 DIAGNOSIS — Z20822 Contact with and (suspected) exposure to covid-19: Secondary | ICD-10-CM

## 2019-07-06 LAB — NOVEL CORONAVIRUS, NAA: SARS-CoV-2, NAA: NOT DETECTED

## 2019-09-26 DIAGNOSIS — M79604 Pain in right leg: Secondary | ICD-10-CM | POA: Insufficient documentation

## 2019-09-26 DIAGNOSIS — I8311 Varicose veins of right lower extremity with inflammation: Secondary | ICD-10-CM | POA: Insufficient documentation

## 2020-07-28 DIAGNOSIS — I10 Essential (primary) hypertension: Secondary | ICD-10-CM | POA: Insufficient documentation

## 2020-08-04 ENCOUNTER — Ambulatory Visit: Payer: Self-pay | Admitting: Podiatry

## 2020-08-13 ENCOUNTER — Encounter: Payer: Self-pay | Admitting: Podiatry

## 2020-08-13 ENCOUNTER — Other Ambulatory Visit: Payer: Self-pay

## 2020-08-13 ENCOUNTER — Ambulatory Visit (INDEPENDENT_AMBULATORY_CARE_PROVIDER_SITE_OTHER): Payer: Medicare Other | Admitting: Podiatry

## 2020-08-13 ENCOUNTER — Ambulatory Visit (INDEPENDENT_AMBULATORY_CARE_PROVIDER_SITE_OTHER): Payer: Medicare Other

## 2020-08-13 DIAGNOSIS — M79672 Pain in left foot: Secondary | ICD-10-CM

## 2020-08-13 DIAGNOSIS — M7671 Peroneal tendinitis, right leg: Secondary | ICD-10-CM

## 2020-08-13 DIAGNOSIS — M79671 Pain in right foot: Secondary | ICD-10-CM

## 2020-08-13 MED ORDER — TRIAMCINOLONE ACETONIDE 10 MG/ML IJ SUSP
10.0000 mg | Freq: Once | INTRAMUSCULAR | Status: AC
  Administered 2020-08-13: 10 mg

## 2020-08-13 NOTE — Progress Notes (Signed)
Subjective:   Patient ID: Betty Carrillo, female   DOB: 65 y.o.   MRN: [id number]   HPI Patient presents stating that she has had a lot of pain in the outside of the right foot which is worsened over the last few months but it is been present for around a year and 1 fashion or another.  Patient states that it gets sore when she tries to be active with it and she does not smoke currently and does like to be active   Review of Systems  All other systems reviewed and are negative.       Objective:  Physical Exam Vitals and nursing note reviewed.  Constitutional:      Appearance: She is well-developed and well-nourished.  Cardiovascular:     Pulses: Intact distal pulses.  Pulmonary:     Effort: Pulmonary effort is normal.  Musculoskeletal:        General: Normal range of motion.  Skin:    General: Skin is warm.  Neurological:     Mental Status: She is alert.     Neurovascular status intact muscle strength found to be adequate range of motion found to be adequate.  Patient is found to have discomfort and inflammation of the fifth metatarsal base right with fluid buildup and does not have any indication of muscle strength loss or other pathology.  Patient is found to have good digital perfusion well oriented x3     Assessment:  Acute peroneal tendinitis at insertion fifth metatarsal base right     Plan:  H&P reviewed condition sterile prep done and injected the fifth metatarsal base 3 mg Dexasone Kenalog 5 mg Xylocaine applied fascial brace to lift up the lateral side of the foot and reappoint to recheck again in the next several weeks  X-rays indicate that there is some reactive bone formation around the fifth metatarsal base but no indications of fracture or other pathology

## 2020-09-03 ENCOUNTER — Other Ambulatory Visit: Payer: Self-pay

## 2020-09-03 ENCOUNTER — Ambulatory Visit (INDEPENDENT_AMBULATORY_CARE_PROVIDER_SITE_OTHER): Payer: Medicare Other | Admitting: Podiatry

## 2020-09-03 ENCOUNTER — Encounter: Payer: Self-pay | Admitting: Podiatry

## 2020-09-03 DIAGNOSIS — M7671 Peroneal tendinitis, right leg: Secondary | ICD-10-CM | POA: Diagnosis not present

## 2020-09-03 DIAGNOSIS — M779 Enthesopathy, unspecified: Secondary | ICD-10-CM | POA: Diagnosis not present

## 2020-09-03 DIAGNOSIS — K625 Hemorrhage of anus and rectum: Secondary | ICD-10-CM | POA: Insufficient documentation

## 2020-09-03 DIAGNOSIS — R195 Other fecal abnormalities: Secondary | ICD-10-CM | POA: Insufficient documentation

## 2020-09-03 DIAGNOSIS — Z1211 Encounter for screening for malignant neoplasm of colon: Secondary | ICD-10-CM | POA: Insufficient documentation

## 2020-09-03 DIAGNOSIS — R194 Change in bowel habit: Secondary | ICD-10-CM | POA: Insufficient documentation

## 2020-09-03 DIAGNOSIS — M21621 Bunionette of right foot: Secondary | ICD-10-CM | POA: Diagnosis not present

## 2020-09-03 DIAGNOSIS — R152 Fecal urgency: Secondary | ICD-10-CM | POA: Insufficient documentation

## 2020-09-03 NOTE — Progress Notes (Signed)
Subjective:   Patient ID: Betty Carrillo, female   DOB: 65 y.o.   MRN: [id number]   HPI Patient presents stating she still having a lot of pain in the outside of her right foot and she is been able to identify its around the head of the bone with a lesion also underneath it.  She states its been about a year its been bothering her and it makes it hard for her to wear shoe gear comfortably and especially at nighttime to sleep comfortably due to inflammation pain    ROS      Objective:  Physical Exam  Neurovascular status intact with patient found to have inflammation pain or fluid buildup around the fifth MPJ wrote that is painful when pressed make shoe gear difficult with a plantar keratotic lesion present      Assessment:  Appears to be an inflammatory condition of the bone structure with prominence of the fifth metatarsal head and plantar keratotic lesion     Plan:  H&P reviewed condition explained to her the structural make-up.  Due to the over year.  Failure to respond conservatively to injection padding I have recommended fifth metatarsal head resection spending a great deal of time educating her that this may or may not solve her problem.  She is willing to accept this wants to have surgery and understands the risk reward ratio and it this time signed consent form after extensive review.  I do want to keep her immobilized postoperatively and I dispensed air fracture walker that I want her to get used to now I like for her to wear prior to procedure and get used to the way to wear it.  Patient is scheduled outpatient surgery is encouraged to call with questions concerns and understands total recovery can take approximate 6 months with no long-term guarantees

## 2020-09-11 ENCOUNTER — Telehealth: Payer: Self-pay

## 2020-09-11 NOTE — Telephone Encounter (Signed)
DOS 09/23/2020  METATARSAL HEAD RES 5TH RT - 28113  [facility] MEDICARE EFFECTIVE 07/19/2020  Notification or Prior Authorization is not required for the requested services  Decision ID #:[id number]

## 2020-09-17 ENCOUNTER — Telehealth: Payer: Self-pay | Admitting: *Deleted

## 2020-09-17 NOTE — Telephone Encounter (Signed)
Patient is calling with concerns after going  to the surgery center and they did not have her on schedule for surgery on March 8th. Please call.

## 2020-09-22 MED ORDER — HYDROCODONE-ACETAMINOPHEN 10-325 MG PO TABS: 1.0000 | ORAL_TABLET | Freq: Three times a day (TID) | ORAL | 0 refills | Status: AC | PRN

## 2020-09-22 NOTE — Addendum Note (Signed)
Addended by: Lenn Sink on: 09/22/2020 06:28 PM   Modules accepted: Orders

## 2020-09-23 ENCOUNTER — Encounter: Payer: Self-pay | Admitting: Podiatry

## 2020-09-23 DIAGNOSIS — M2011 Hallux valgus (acquired), right foot: Secondary | ICD-10-CM | POA: Diagnosis not present

## 2020-09-29 ENCOUNTER — Encounter: Payer: Self-pay | Admitting: Podiatry

## 2020-09-29 ENCOUNTER — Ambulatory Visit (INDEPENDENT_AMBULATORY_CARE_PROVIDER_SITE_OTHER): Payer: Medicare Other

## 2020-09-29 ENCOUNTER — Other Ambulatory Visit: Payer: Self-pay

## 2020-09-29 ENCOUNTER — Ambulatory Visit (INDEPENDENT_AMBULATORY_CARE_PROVIDER_SITE_OTHER): Payer: Medicare Other | Admitting: Podiatry

## 2020-09-29 DIAGNOSIS — M21621 Bunionette of right foot: Secondary | ICD-10-CM | POA: Diagnosis not present

## 2020-09-29 NOTE — Progress Notes (Signed)
Subjective:   Patient ID: Betty Carrillo, female   DOB: 65 y.o.   MRN: [id number]   HPI Patient presents stating I am doing well and healing wound edges well coapted and very pleased so far neuro   ROS      Objective:  Physical Exam  Vascular status intact negative Denna Haggard' sign noted incision site right healing well wound edges well coapted no drainage no redness no swelling     Assessment:  Doing well post fifth metatarsal head resection right     Plan:  H&P x-ray reviewed sterile dressing reapplied continue immobilization elevation compression reappoint to recheck and begin gradual shoe gear usage in about 3 weeks with surgical shoe dispensed today  X-rays indicate satisfactory resection head of fifth metatarsal right

## 2020-10-05 IMAGING — DX DG KNEE 1-2V PORT*L*
2 series · 2 of 2 positions shown · non-contrast
Comparison: 07/01/2016.

CLINICAL DATA: Left partial knee replacement.

EXAM:
PORTABLE LEFT KNEE - 1-2 VIEW

[knee ap]
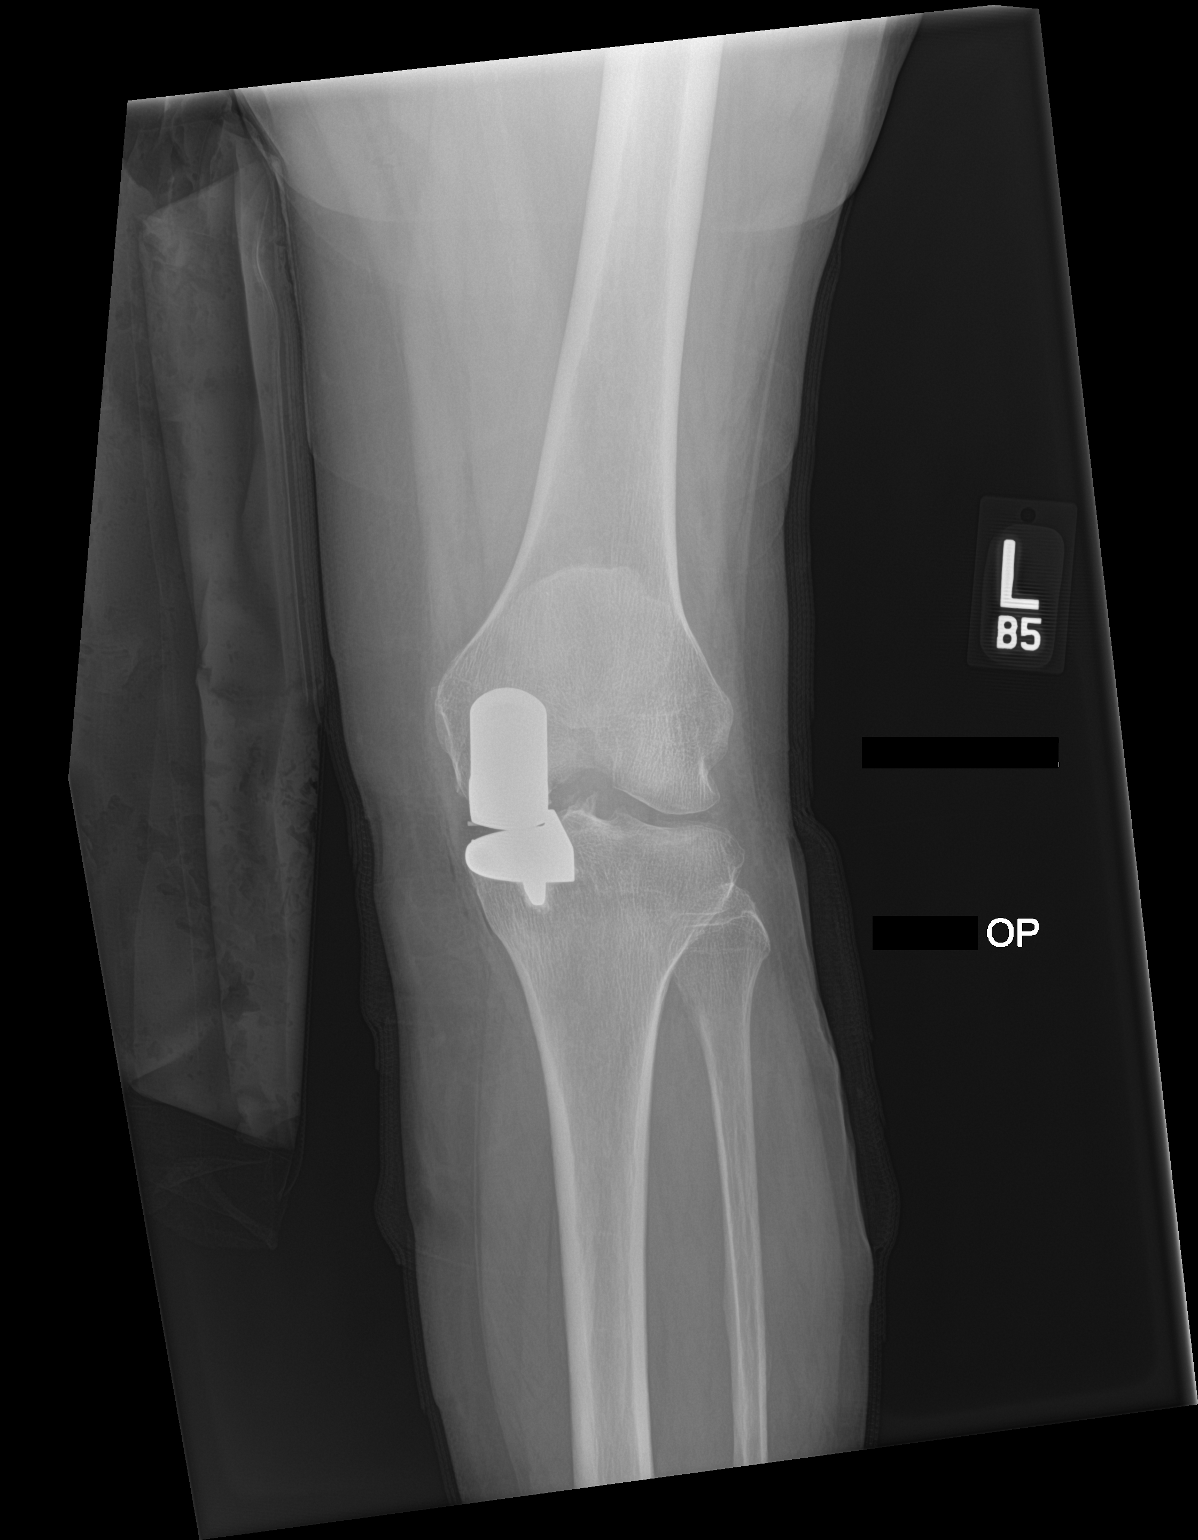

[knee lat]
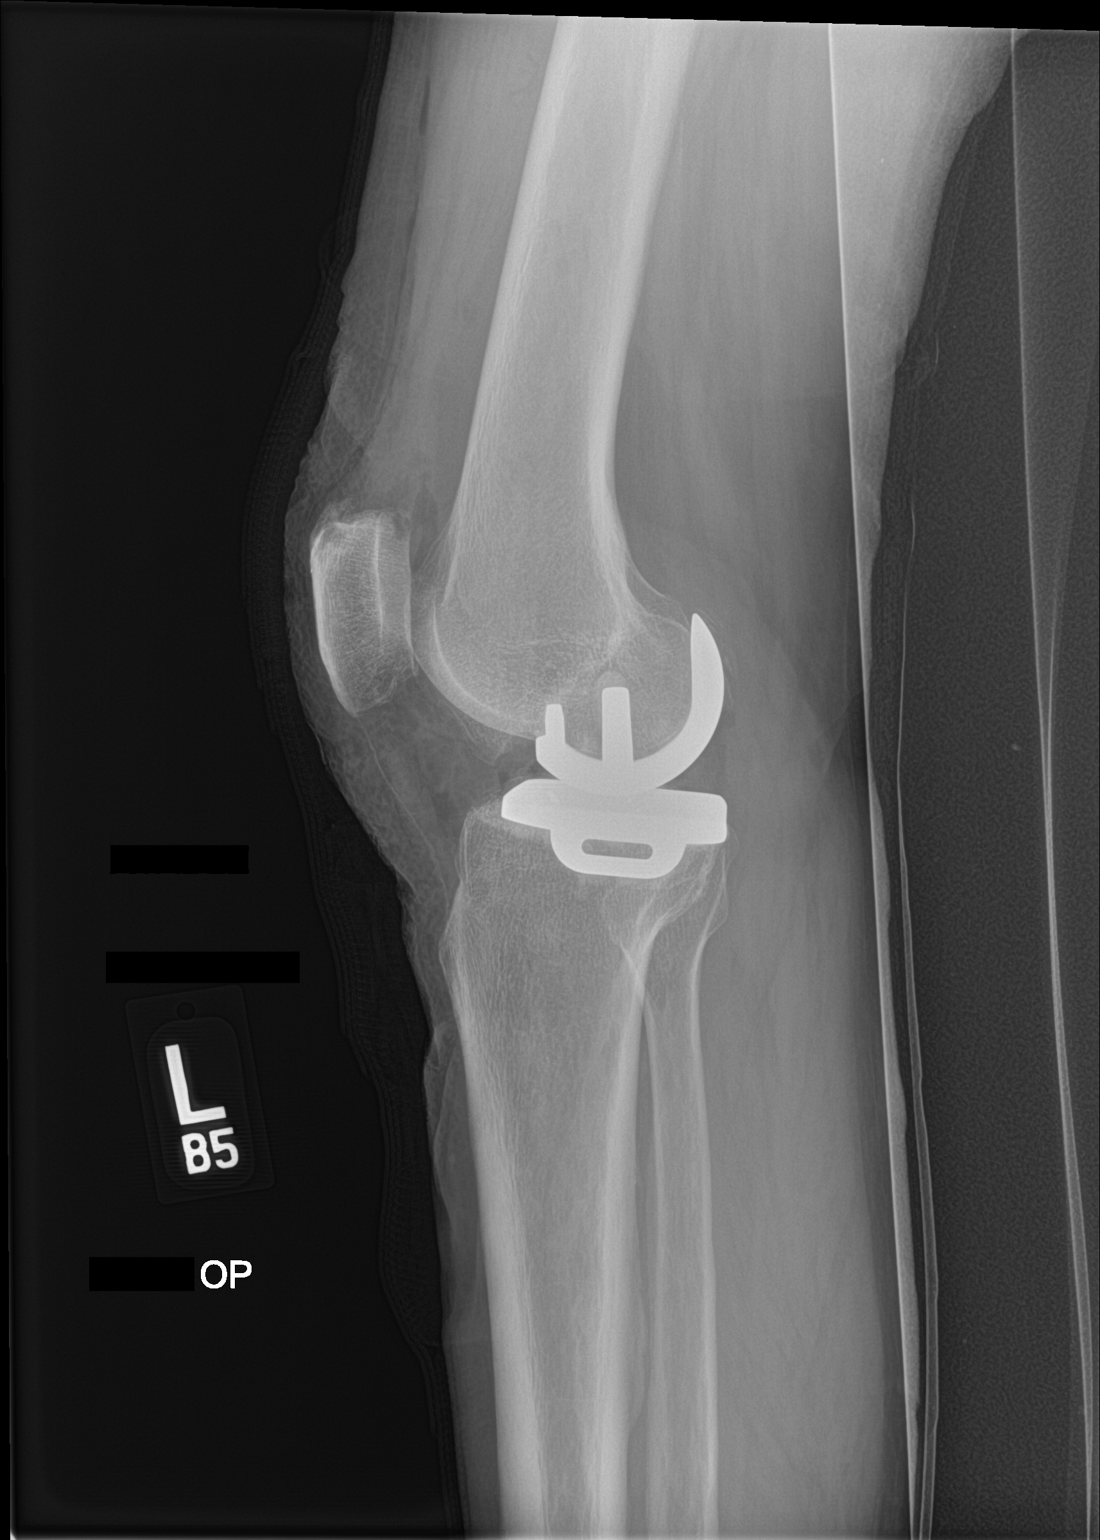

[2 of 2 positions shown; findings below may reference images not displayed]

FINDINGS: Medial compartment knee replacement noted medially. Hardware intact.
Anatomic alignment. Diffuse degenerative change. Loose bodies noted.
No acute bony abnormality identified. No evidence of fracture.
IMPRESSION: Medial compartment left knee replacement. Hardware intact. Anatomic
alignment.

## 2020-10-06 ENCOUNTER — Encounter: Payer: Medicare Other | Admitting: Podiatry

## 2021-01-13 ENCOUNTER — Other Ambulatory Visit (HOSPITAL_COMMUNITY): Payer: Self-pay | Admitting: *Deleted

## 2021-01-14 ENCOUNTER — Other Ambulatory Visit: Payer: Self-pay

## 2021-01-14 ENCOUNTER — Ambulatory Visit (HOSPITAL_COMMUNITY)
Admission: RE | Admit: 2021-01-14 | Discharge: 2021-01-14 | Disposition: A | Discharge status: Home / Self Care | Payer: Medicare Other | Source: Ambulatory Visit | Attending: Sports Medicine | Admitting: Sports Medicine

## 2021-01-14 DIAGNOSIS — M810 Age-related osteoporosis without current pathological fracture: Secondary | ICD-10-CM | POA: Insufficient documentation

## 2021-01-14 MED ORDER — ZOLEDRONIC ACID 5 MG/100ML IV SOLN
INTRAVENOUS | Status: AC
  Administered 2021-01-14: 5 mg via INTRAVENOUS
  Filled 2021-01-14: qty 100

## 2021-01-14 MED ORDER — ZOLEDRONIC ACID 5 MG/100ML IV SOLN: 5.0000 mg | Freq: Once | INTRAVENOUS | Status: AC

## 2022-01-01 ENCOUNTER — Other Ambulatory Visit: Payer: Self-pay | Admitting: Pharmacy Technician

## 2022-01-01 DIAGNOSIS — M810 Age-related osteoporosis without current pathological fracture: Secondary | ICD-10-CM

## 2022-01-04 ENCOUNTER — Telehealth: Payer: Self-pay | Admitting: Pharmacy Technician

## 2022-01-04 NOTE — Telephone Encounter (Signed)
Auth Submission: No auth needed Payer: [facility] Medicare/Tricare Medication & CPT/J Code(s) submitted: Reclast (Zolendronic acid) [id number] Route of submission (phone, fax, portal): Portal    Patient will be scheduled asap.

## 2022-01-14 ENCOUNTER — Ambulatory Visit (INDEPENDENT_AMBULATORY_CARE_PROVIDER_SITE_OTHER): Payer: Medicare Other

## 2022-01-14 VITALS — BP 103/61 | HR 67 | Temp 98.1°F | Resp 16 | Ht 60.5 in | Wt 139.6 lb

## 2022-01-14 DIAGNOSIS — M810 Age-related osteoporosis without current pathological fracture: Secondary | ICD-10-CM

## 2022-01-14 MED ORDER — SODIUM CHLORIDE 0.9 % IV SOLN: INTRAVENOUS | Status: DC

## 2022-01-14 MED ORDER — ACETAMINOPHEN 325 MG PO TABS
650.0000 mg | ORAL_TABLET | Freq: Once | ORAL | Status: AC
  Administered 2022-01-14: 650 mg via ORAL
  Filled 2022-01-14: qty 2

## 2022-01-14 MED ORDER — DIPHENHYDRAMINE HCL 25 MG PO CAPS
25.0000 mg | ORAL_CAPSULE | Freq: Once | ORAL | Status: AC
  Administered 2022-01-14: 25 mg via ORAL
  Filled 2022-01-14: qty 1

## 2022-01-14 MED ORDER — ZOLEDRONIC ACID 5 MG/100ML IV SOLN
5.0000 mg | Freq: Once | INTRAVENOUS | Status: AC
  Administered 2022-01-14: 5 mg via INTRAVENOUS
  Filled 2022-01-14: qty 100

## 2022-01-14 NOTE — Progress Notes (Signed)
Diagnosis: Osteoporosis  Provider:  Chilton Greathouse, MD  Procedure: Infusion  IV Type: Peripheral, IV Location: R Hand  Reclast (Zolendronic Acid), Dose: 5 mg  Infusion Start Time: 1005  Infusion Stop Time: 1049  Post Infusion IV Care: Peripheral IV Discontinued  Discharge: Condition: Good, Destination: Home . AVS provided to patient.   Performed by:  Marilynn Rail, RN

## 2022-11-11 ENCOUNTER — Telehealth: Payer: Self-pay

## 2022-11-11 ENCOUNTER — Encounter: Payer: Self-pay | Admitting: Nurse Practitioner

## 2022-11-11 ENCOUNTER — Ambulatory Visit (INDEPENDENT_AMBULATORY_CARE_PROVIDER_SITE_OTHER): Payer: Medicare Other | Admitting: Nurse Practitioner

## 2022-11-11 VITALS — BP 134/72 | HR 69 | Temp 97.7°F | Ht 60.5 in | Wt 135.4 lb

## 2022-11-11 DIAGNOSIS — G4719 Other hypersomnia: Secondary | ICD-10-CM | POA: Diagnosis not present

## 2022-11-11 DIAGNOSIS — Z720 Tobacco use: Secondary | ICD-10-CM

## 2022-11-11 DIAGNOSIS — G4700 Insomnia, unspecified: Secondary | ICD-10-CM

## 2022-11-11 DIAGNOSIS — G4733 Obstructive sleep apnea (adult) (pediatric): Secondary | ICD-10-CM

## 2022-11-11 NOTE — Assessment & Plan Note (Addendum)
Active smoker - 1 ppd; 53 pack year hx. Referral to lung cancer screening program.

## 2022-11-11 NOTE — Assessment & Plan Note (Signed)
Currently managed on trazodone. Continues to have difficulties with sleep maintenance, possibly due to underlying untreated OSA. See above plan.

## 2022-11-11 NOTE — Telephone Encounter (Signed)
Patient called back and said that she started smoking since she was 14 yrs. Off and on and is currently smoking about a pack a day.  Please call patient back if any further information is needed.  CB# [phone].

## 2022-11-11 NOTE — Assessment & Plan Note (Signed)
She has snoring, excessive daytime sleepiness, restless sleep. History of OSA, HTN. Given this,  I am concerned she still has sleep disordered breathing with obstructive sleep apnea. She will need sleep study for further evaluation.    - discussed how weight can impact sleep and risk for sleep disordered breathing - discussed options to assist with weight loss: combination of diet modification, cardiovascular and strength training exercises   - had an extensive discussion regarding the adverse health consequences related to untreated sleep disordered breathing - specifically discussed the risks for hypertension, coronary artery disease, cardiac dysrhythmias, cerebrovascular disease, and diabetes - lifestyle modification discussed   - discussed how sleep disruption can increase risk of accidents, particularly when driving - safe driving practices were discussed  Patient Instructions  Given your symptoms, I am concerned that you may have sleep disordered breathing with sleep apnea. You will need a sleep study for further evaluation. Someone will contact you to schedule this.   We discussed how untreated sleep apnea puts an individual at risk for cardiac arrhthymias, pulm HTN, DM, stroke and increases their risk for daytime accidents. We also briefly reviewed treatment options including weight loss, side sleeping position, oral appliance, CPAP therapy or referral to ENT for possible surgical options  Use caution when driving and pull over if you become sleepy.  Follow up in 6 weeks with Katie Undra Trembath,NP to go over sleep study results, or sooner, if needed

## 2022-11-11 NOTE — Telephone Encounter (Signed)
Thanks! Nothing further needed. Referral to lung cancer screening program placed.

## 2022-11-11 NOTE — Progress Notes (Signed)
@Patient  ID: Betty Carrillo, female    DOB: 1956-04-10, 67 y.o.   MRN: [id number]  Chief Complaint  Patient presents with   Consult    Pt says she wakes up several times a night. Pt states she had a sleep study before in 2000. Pt does snore.     Referring provider: Richmond Campbell., PA-C  HPI: 67 year old female, active smoker referred for sleep consult. Past medical history significant for HTN, fibromyalgia.  TEST/EVENTS:   11/11/2022: Today - sleep consult Patient presents today for sleep consult, referred by Mady Gemma, PA.  She was diagnosed with sleep apnea many years ago.  Thinks that it was sometime before 2000.  She was never started on CPAP therapy.  She did not have any significant symptoms at the time.  However over the last couple of years, she has noticed that she is more more tired during the day.  She wakes feeling poorly rested.  She has poor sleep quality at night, waking numerous times.  She has been told that she snores.  Denies any witnessed apneas, morning headaches, drowsy driving, sleep parasomnia/paralysis.  No history of narcolepsy or symptoms of cataplexy She goes to bed around 8 PM.  She usually takes trazodone 100 mg which helps her fall asleep quickly.  Unfortunately this does not keep her sleep.  She wakes multiple times at night.  She gets up around 5 AM.  She is retired.  Weight is down 20 pounds over the last 2 years.  She has a history of high blood pressure, controlled on antihypertensives.  No history of cardiac arrhythmias, DM or stroke. She is an active smoker.  Smokes 1 pack/day.  Lives at home with her husband.  Family history of father with cancer.  Epworth 0   Allergies  Allergen Reactions   Sulfamethoxazole Other (See Comments)    unknown   Sulfonamide Derivatives Rash    Immunization History  Administered Date(s) Administered   Tdap 02/13/2015   Zoster, Live 07/07/2015    Past Medical History:  Diagnosis Date   Anal fissure     Arthritis    Colon polyp    Depression    Fibromyalgia    Hearing deficit    Hypertension    was dx some time ago , was placed on medication then stopped to manage with diet and exercise ,    Primary localized osteoarthritis of left knee 08/22/2018    Tobacco History: Social History   Tobacco Use  Smoking Status Every Day   Packs/day: 1.00   Years: 53.00   Additional pack years: 0.00   Total pack years: 53.00   Types: Cigarettes   Start date: 1971  Smokeless Tobacco Never  Tobacco Comments   stopped for surgery ; last  cigarette  08-15-2018   Ready to quit: Not Answered Counseling given: Not Answered Tobacco comments: stopped for surgery ; last  cigarette  08-15-2018   Outpatient Medications Prior to Visit  Medication Sig Dispense Refill   calcium carbonate (OS-CAL) 1250 (500 Ca) MG chewable tablet Chew 1 tablet by mouth daily.     calcium-vitamin D (OSCAL WITH D) 500-200 MG-UNIT tablet Take 1 tablet by mouth.     Multiple Vitamin (MULTIVITAMIN) capsule Take 1 capsule by mouth daily.     omega-3 acid ethyl esters (LOVAZA) 1 g capsule Take by mouth 2 (two) times daily.     triamcinolone (KENALOG) 0.1 % SMARTSIG:1 Application Topical 2-3 Times Daily  vitamin k 100 MCG tablet Take 100 mcg by mouth daily.     antiseptic oral rinse (BIOTENE) LIQD 15 mLs by Mouth Rinse route as needed for dry mouth.     No facility-administered medications prior to visit.     Review of Systems:   Constitutional: No night sweats, fevers, chills, or lassitude. +intentional weight loss, daytime fatigue  HEENT: No headaches, difficulty swallowing, tooth/dental problems, or sore throat. No sneezing, itching, ear ache, nasal congestion, or post nasal drip CV:  No chest pain, orthopnea, PND, swelling in lower extremities, anasarca, dizziness, palpitations, syncope Resp: +snoring, chronic cough. No shortness of breath with exertion or at rest. No hemoptysis. No wheezing.  No chest wall  deformity GI:  No heartburn, indigestion, abdominal pain, nausea, vomiting, diarrhea, change in bowel habits, loss of appetite, bloody stools.  GU: No dysuria, change in color of urine, urgency or frequency.  Skin: No rash, lesions, ulcerations MSK:  No joint pain or swelling.   Neuro: No dizziness or lightheadedness.  Psych: No depression or anxiety. Mood stable. +sleep disturbance    Physical Exam:  BP 134/72   Pulse 69   Temp 97.7 F (36.5 C) (Oral)   Ht 5' 0.5" (1.537 m)   Wt 135 lb 6.4 oz (61.4 kg)   SpO2 96%   BMI 26.01 kg/m   GEN: Pleasant, interactive, well-appearing; in no acute distress. HEENT:  Normocephalic and atraumatic. PERRLA. Sclera white. Nasal turbinates pink, moist and patent bilaterally. No rhinorrhea present. Oropharynx pink and moist, without exudate or edema. No lesions, ulcerations, or postnasal drip. Mallampati III NECK:  Supple w/ fair ROM. No JVD present. Normal carotid impulses w/o bruits. Thyroid symmetrical with no goiter or nodules palpated. No lymphadenopathy.   CV: RRR, no m/r/g, no peripheral edema. Pulses intact, +2 bilaterally. No cyanosis, pallor or clubbing. PULMONARY:  Unlabored, regular breathing. Clear bilaterally A&P w/o wheezes/rales/rhonchi. No accessory muscle use.  GI: BS present and normoactive. Soft, non-tender to palpation. No organomegaly or masses detected.  MSK: No erythema, warmth or tenderness. Cap refil <2 sec all extrem. No deformities or joint swelling noted.  Neuro: A/Ox3. No focal deficits noted.   Skin: Warm, no lesions or rashe Psych: Normal affect and behavior. Judgement and thought content appropriate.     Lab Results:  CBC    Component Value Date/Time   WBC 13.1 (H) 08/23/2018 0543   RBC 4.97 08/23/2018 0543   HGB 14.4 08/23/2018 0543   HCT 45.3 08/23/2018 0543   PLT 232 08/23/2018 0543   MCV 91.1 08/23/2018 0543   MCH 29.0 08/23/2018 0543   MCHC 31.8 08/23/2018 0543   RDW 12.1 08/23/2018 0543    LYMPHSABS 1.5 03/29/2011 1619   MONOABS 0.6 03/29/2011 1619   EOSABS 0.2 03/29/2011 1619   BASOSABS 0.0 03/29/2011 1619    BMET    Component Value Date/Time   NA 139 08/23/2018 0543   K 4.1 08/23/2018 0543   CL 108 08/23/2018 0543   CO2 23 08/23/2018 0543   GLUCOSE 170 (H) 08/23/2018 0543   BUN 19 08/23/2018 0543   CREATININE 0.85 08/23/2018 0543   CALCIUM 8.7 (L) 08/23/2018 0543   GFRNONAA >60 08/23/2018 0543   GFRAA >60 08/23/2018 0543    BNP No results found for: "BNP"   Imaging:  No results found.        No data to display          No results found for: "NITRICOXIDE"  Assessment & Plan:   Excessive daytime sleepiness She has snoring, excessive daytime sleepiness, restless sleep. History of OSA, HTN. Given this,  I am concerned she still has sleep disordered breathing with obstructive sleep apnea. She will need sleep study for further evaluation.    - discussed how weight can impact sleep and risk for sleep disordered breathing - discussed options to assist with weight loss: combination of diet modification, cardiovascular and strength training exercises   - had an extensive discussion regarding the adverse health consequences related to untreated sleep disordered breathing - specifically discussed the risks for hypertension, coronary artery disease, cardiac dysrhythmias, cerebrovascular disease, and diabetes - lifestyle modification discussed   - discussed how sleep disruption can increase risk of accidents, particularly when driving - safe driving practices were discussed  Patient Instructions  Given your symptoms, I am concerned that you may have sleep disordered breathing with sleep apnea. You will need a sleep study for further evaluation. Someone will contact you to schedule this.   We discussed how untreated sleep apnea puts an individual at risk for cardiac arrhthymias, pulm HTN, DM, stroke and increases their risk for daytime accidents. We  also briefly reviewed treatment options including weight loss, side sleeping position, oral appliance, CPAP therapy or referral to ENT for possible surgical options  Use caution when driving and pull over if you become sleepy.  Follow up in 6 weeks with Betty Antwanette Wesche,NP to go over sleep study results, or sooner, if needed     Insomnia Currently managed on trazodone. Continues to have difficulties with sleep maintenance, possibly due to underlying untreated OSA. See above plan.  Tobacco abuse Active smoker - 1 ppd; 53 pack year hx. Referral to lung cancer screening program.   I spent 45 minutes of dedicated to the care of this patient on the date of this encounter to include pre-visit review of records, face-to-face time with the patient discussing conditions above, post visit ordering of testing, clinical documentation with the electronic health record, making appropriate referrals as documented, and communicating necessary findings to members of the patients care team.  Noemi Chapel, NP 11/11/2022  Pt aware and understands NP's role.

## 2022-11-11 NOTE — Telephone Encounter (Signed)
Will route to Betty Carrillo so she is aware.

## 2022-11-11 NOTE — Patient Instructions (Signed)
Given your symptoms, I am concerned that you may have sleep disordered breathing with sleep apnea. You will need a sleep study for further evaluation. Someone will contact you to schedule this.   We discussed how untreated sleep apnea puts an individual at risk for cardiac arrhthymias, pulm HTN, DM, stroke and increases their risk for daytime accidents. We also briefly reviewed treatment options including weight loss, side sleeping position, oral appliance, CPAP therapy or referral to ENT for possible surgical options  Use caution when driving and pull over if you become sleepy.  Follow up in 6 weeks with Katie Victora Irby,NP to go over sleep study results, or sooner, if needed   

## 2022-11-11 NOTE — Telephone Encounter (Signed)
Called pt to verify smoking history. No answer LVMM for pt to call office back. Please ask pt how long has she been smoking, so we can calculate her smoking history.

## 2022-12-08 ENCOUNTER — Ambulatory Visit (INDEPENDENT_AMBULATORY_CARE_PROVIDER_SITE_OTHER): Payer: Medicare Other | Admitting: Adult Health

## 2022-12-08 DIAGNOSIS — G4733 Obstructive sleep apnea (adult) (pediatric): Secondary | ICD-10-CM | POA: Diagnosis not present

## 2022-12-14 NOTE — Progress Notes (Signed)
Mild sleep apnea. We can discuss her sleep study at follow up 6/12 or you can schedule her as a video visit tomorrow in one of my open slots. Thanks.

## 2022-12-29 ENCOUNTER — Ambulatory Visit (INDEPENDENT_AMBULATORY_CARE_PROVIDER_SITE_OTHER): Payer: Medicare Other | Admitting: Nurse Practitioner

## 2022-12-29 ENCOUNTER — Encounter: Payer: Self-pay | Admitting: Nurse Practitioner

## 2022-12-29 VITALS — BP 106/62 | HR 68 | Temp 97.7°F | Ht 60.5 in | Wt 135.4 lb

## 2022-12-29 DIAGNOSIS — Z720 Tobacco use: Secondary | ICD-10-CM

## 2022-12-29 DIAGNOSIS — G4700 Insomnia, unspecified: Secondary | ICD-10-CM | POA: Diagnosis not present

## 2022-12-29 DIAGNOSIS — G4733 Obstructive sleep apnea (adult) (pediatric): Secondary | ICD-10-CM | POA: Diagnosis not present

## 2022-12-29 NOTE — Patient Instructions (Addendum)
Start auto CPAP 5-15 cmH2O, nasal mask of choice and heated humidity every night, minimum of 4-6 hours a night.  Change equipment every 30 days or as directed by DME. Wash your tubing with warm soap and water daily, hang to dry. Wash humidifier portion weekly. Use bottled, distilled water - change daily  Be aware of reduced alertness and do not drive or operate heavy machinery if experiencing this or drowsiness.  Notify if persistent daytime sleepiness occurs even with consistent use of CPAP.  Change supplies out... Every month Mask cushions and/or nasal pillows CPAP machine filters Every 3 months Mask frame (not including the headgear) CPAP tubing Every 6 months Mask headgear Chin strap (if applicable) Humidifier water tub  We discussed how untreated sleep apnea puts an individual at risk for cardiac arrhthymias, pulm HTN, DM, stroke and increases their risk for daytime accidents. We also briefly reviewed treatment options including weight loss, side sleeping position, oral appliance, CPAP therapy or referral to ENT for possible surgical options  I will follow up on the referral to the lung cancer screening program   Follow up in 12 weeks with Dr. Wynona Neat or Philis Nettle, or sooner, if needed

## 2022-12-29 NOTE — Assessment & Plan Note (Signed)
Mild OSA with AHI 12.1/h. Reviewed risks of untreated mild OSA and potential treatment options. Given her daytime symptoms and poor sleep quality, shared decision to move forward with CPAP therapy. Educated on proper use/care of device. Risks/benefits reviewed. Cautioned on safe driving practices.  Patient Instructions  Start auto CPAP 5-15 cmH2O, nasal mask of choice and heated humidity every night, minimum of 4-6 hours a night.  Change equipment every 30 days or as directed by DME. Wash your tubing with warm soap and water daily, hang to dry. Wash humidifier portion weekly. Use bottled, distilled water - change daily  Be aware of reduced alertness and do not drive or operate heavy machinery if experiencing this or drowsiness.  Notify if persistent daytime sleepiness occurs even with consistent use of CPAP.  Change supplies out... Every month Mask cushions and/or nasal pillows CPAP machine filters Every 3 months Mask frame (not including the headgear) CPAP tubing Every 6 months Mask headgear Chin strap (if applicable) Humidifier water tub  We discussed how untreated sleep apnea puts an individual at risk for cardiac arrhthymias, pulm HTN, DM, stroke and increases their risk for daytime accidents. We also briefly reviewed treatment options including weight loss, side sleeping position, oral appliance, CPAP therapy or referral to ENT for possible surgical options  I will follow up on the referral to the lung cancer screening program   Follow up in 12 weeks with Dr. Wynona Neat or Philis Nettle, or sooner, if needed

## 2022-12-29 NOTE — Progress Notes (Signed)
@Patient  ID: Betty Carrillo, female    DOB: [birth year], 67 y.o.   MRN: [id number]  Chief Complaint  Patient presents with   Follow-up    Hst review.     Referring provider: Richmond Campbell., PA-C  HPI: 67 year old female, active smoker referred for sleep consult 11/11/2022. Past medical history significant for HTN, fibromyalgia.  TEST/EVENTS:  11/24/2022 HST: AHI 12.1/h, SpO2 low 84%  11/11/2022: OV with Carnetta Losada for sleep consult, referred by Mady Gemma, PA.  She was diagnosed with sleep apnea many years ago.  Thinks that it was sometime before 2000.  She was never started on CPAP therapy.  She did not have any significant symptoms at the time.  However over the last couple of years, she has noticed that she is more more tired during the day.  She wakes feeling poorly rested.  She has poor sleep quality at night, waking numerous times.  She has been told that she snores.  Denies any witnessed apneas, morning headaches, drowsy driving, sleep parasomnia/paralysis.  No history of narcolepsy or symptoms of cataplexy She goes to bed around 8 PM.  She usually takes trazodone 100 mg which helps her fall asleep quickly.  Unfortunately this does not keep her sleep.  She wakes multiple times at night.  She gets up around 5 AM.  She is retired.  Weight is down 20 pounds over the last 2 years.  She has a history of high blood pressure, controlled on antihypertensives.  No history of cardiac arrhythmias, DM or stroke. She is an active smoker.  Smokes 1 pack/day.  Lives at home with her husband.  Family history of father with cancer. [place] 0  12/29/2022: Today - follow up Patient presents today for follow up to discuss home sleep study results, which revealed mild OSA. She continues to have trouble with daytime tiredness and poor sleep quality. She would like to discuss her treatment options. She is on trazodone 100 mg nightly right now. Does help her fall asleep but still wakes throughout the night. Denies  drowsy driving or morning headaches,.   Allergies  Allergen Reactions   Sulfamethoxazole Other (See Comments)    unknown   Sulfonamide Derivatives Rash    Immunization History  Administered Date(s) Administered   Tdap 02/13/2015   Zoster, Live 07/07/2015    Past Medical History:  Diagnosis Date   Anal fissure    Arthritis    Colon polyp    Depression    Fibromyalgia    Hearing deficit    Hypertension    was dx some time ago , was placed on medication then stopped to manage with diet and exercise ,    Primary localized osteoarthritis of left knee 08/22/2018    Tobacco History: Social History   Tobacco Use  Smoking Status Every Day   Packs/day: 1.00   Years: 53.00   Additional pack years: 0.00   Total pack years: 53.00   Types: Cigarettes   Start date: 1971  Smokeless Tobacco Never  Tobacco Comments   stopped for surgery ; last  cigarette  08-15-2018   Ready to quit: Not Answered Counseling given: Not Answered Tobacco comments: stopped for surgery ; last  cigarette  08-15-2018   Outpatient Medications Prior to Visit  Medication Sig Dispense Refill   calcium carbonate (OS-CAL) 1250 (500 Ca) MG chewable tablet Chew 1 tablet by mouth daily.     calcium-vitamin D (OSCAL WITH D) 500-200 MG-UNIT tablet Take 1 tablet by mouth.  Multiple Vitamin (MULTIVITAMIN) capsule Take 1 capsule by mouth daily.     omega-3 acid ethyl esters (LOVAZA) 1 g capsule Take by mouth 2 (two) times daily.     triamcinolone (KENALOG) 0.1 % SMARTSIG:1 Application Topical 2-3 Times Daily     vitamin k 100 MCG tablet Take 100 mcg by mouth daily.     antiseptic oral rinse (BIOTENE) LIQD 15 mLs by Mouth Rinse route as needed for dry mouth.     No facility-administered medications prior to visit.     Review of Systems:   Constitutional: No night sweats, fevers, chills, or lassitude. +intentional weight loss, daytime fatigue  HEENT: No headaches, difficulty swallowing, tooth/dental problems,  or sore throat. No sneezing, itching, ear ache, nasal congestion, or post nasal drip CV:  No chest pain, orthopnea, PND, swelling in lower extremities, anasarca, dizziness, palpitations, syncope Resp: +snoring, chronic cough. No shortness of breath with exertion or at rest. No hemoptysis. No wheezing.  No chest wall deformity GI:  No heartburn, indigestion, abdominal pain, nausea, vomiting, diarrhea, change in bowel habits, loss of appetite, bloody stools.  GU: No dysuria, change in color of urine, urgency or frequency.  Skin: No rash, lesions, ulcerations MSK:  No joint pain or swelling.   Neuro: No dizziness or lightheadedness.  Psych: No depression or anxiety. Mood stable. +sleep disturbance    Physical Exam:  BP 106/62   Pulse 68   Temp 97.7 F (36.5 C) (Oral)   Ht 5' 0.5" (1.537 m)   Wt 135 lb 6.4 oz (61.4 kg)   SpO2 97%   BMI 26.01 kg/m   GEN: Pleasant, interactive, well-appearing; in no acute distress. HEENT:  Normocephalic and atraumatic. PERRLA. Sclera white. Nasal turbinates pink, moist and patent bilaterally. No rhinorrhea present. Oropharynx pink and moist, without exudate or edema. No lesions, ulcerations, or postnasal drip. Mallampati III NECK:  Supple w/ fair ROM. No JVD present. Normal carotid impulses w/o bruits. Thyroid symmetrical with no goiter or nodules palpated. No lymphadenopathy.   CV: RRR, no m/r/g, no peripheral edema. Pulses intact, +2 bilaterally. No cyanosis, pallor or clubbing. PULMONARY:  Unlabored, regular breathing. Clear bilaterally A&P w/o wheezes/rales/rhonchi. No accessory muscle use.  GI: BS present and normoactive. Soft, non-tender to palpation. No organomegaly or masses detected.  MSK: No erythema, warmth or tenderness. Cap refil <2 sec all extrem. No deformities or joint swelling noted.  Neuro: A/Ox3. No focal deficits noted.   Skin: Warm, no lesions or rashe Psych: Normal affect and behavior. Judgement and thought content appropriate.      Lab Results:  CBC    Component Value Date/Time   WBC 13.1 (H) 08/23/2018 0543   RBC 4.97 08/23/2018 0543   HGB 14.4 08/23/2018 0543   HCT 45.3 08/23/2018 0543   PLT 232 08/23/2018 0543   MCV 91.1 08/23/2018 0543   MCH 29.0 08/23/2018 0543   MCHC 31.8 08/23/2018 0543   RDW 12.1 08/23/2018 0543   LYMPHSABS 1.5 03/29/2011 1619   MONOABS 0.6 03/29/2011 1619   EOSABS 0.2 03/29/2011 1619   BASOSABS 0.0 03/29/2011 1619    BMET    Component Value Date/Time   NA 139 08/23/2018 0543   K 4.1 08/23/2018 0543   CL 108 08/23/2018 0543   CO2 23 08/23/2018 0543   GLUCOSE 170 (H) 08/23/2018 0543   BUN 19 08/23/2018 0543   CREATININE 0.85 08/23/2018 0543   CALCIUM 8.7 (L) 08/23/2018 0543   GFRNONAA >60 08/23/2018 0543   GFRAA >60 08/23/2018  0543    BNP No results found for: "BNP"   Imaging:  No results found.        No data to display          No results found for: "NITRICOXIDE"      Assessment & Plan:   Mild obstructive sleep apnea Mild OSA with AHI 12.1/h. Reviewed risks of untreated mild OSA and potential treatment options. Given her daytime symptoms and poor sleep quality, shared decision to move forward with CPAP therapy. Educated on proper use/care of device. Risks/benefits reviewed. Cautioned on safe driving practices.  Patient Instructions  Start auto CPAP 5-15 cmH2O, nasal mask of choice and heated humidity every night, minimum of 4-6 hours a night.  Change equipment every 30 days or as directed by DME. Wash your tubing with warm soap and water daily, hang to dry. Wash humidifier portion weekly. Use bottled, distilled water - change daily  Be aware of reduced alertness and do not drive or operate heavy machinery if experiencing this or drowsiness.  Notify if persistent daytime sleepiness occurs even with consistent use of CPAP.  Change supplies out... Every month Mask cushions and/or nasal pillows CPAP machine filters Every 3 months Mask frame  (not including the headgear) CPAP tubing Every 6 months Mask headgear Chin strap (if applicable) Humidifier water tub  We discussed how untreated sleep apnea puts an individual at risk for cardiac arrhthymias, pulm HTN, DM, stroke and increases their risk for daytime accidents. We also briefly reviewed treatment options including weight loss, side sleeping position, oral appliance, CPAP therapy or referral to ENT for possible surgical options  I will follow up on the referral to the lung cancer screening program   Follow up in 12 weeks with Dr. Wynona Neat or Philis Nettle, or sooner, if needed    Insomnia Possibly due to untreated OSA. Will assess response to CPAP and determine if alternative pharmacological therapy is necessary. Sleep hygiene reviewed.   Tobacco abuse 53 pack year history. Active smoker. Smoking cessation strongly encouraged. We referred her to the lung cancer screening program. She has yet to hear from them - will f/u today.   I spent 35 minutes of dedicated to the care of this patient on the date of this encounter to include pre-visit review of records, face-to-face time with the patient discussing conditions above, post visit ordering of testing, clinical documentation with the electronic health record, making appropriate referrals as documented, and communicating necessary findings to members of the patients care team.  Noemi Chapel, NP 12/29/2022  Pt aware and understands NP's role.

## 2022-12-29 NOTE — Assessment & Plan Note (Signed)
53 pack year history. Active smoker. Smoking cessation strongly encouraged. We referred her to the lung cancer screening program. She has yet to hear from them - will f/u today.

## 2022-12-29 NOTE — Assessment & Plan Note (Signed)
Possibly due to untreated OSA. Will assess response to CPAP and determine if alternative pharmacological therapy is necessary. Sleep hygiene reviewed.

## 2022-12-31 ENCOUNTER — Other Ambulatory Visit: Payer: Self-pay

## 2022-12-31 DIAGNOSIS — G4733 Obstructive sleep apnea (adult) (pediatric): Secondary | ICD-10-CM

## 2023-01-06 ENCOUNTER — Encounter: Payer: Self-pay | Admitting: *Deleted

## 2023-01-12 ENCOUNTER — Other Ambulatory Visit: Payer: Self-pay | Admitting: *Deleted

## 2023-01-12 DIAGNOSIS — Z122 Encounter for screening for malignant neoplasm of respiratory organs: Secondary | ICD-10-CM

## 2023-01-12 DIAGNOSIS — F17210 Nicotine dependence, cigarettes, uncomplicated: Secondary | ICD-10-CM

## 2023-01-12 DIAGNOSIS — Z87891 Personal history of nicotine dependence: Secondary | ICD-10-CM

## 2023-01-13 ENCOUNTER — Telehealth: Payer: Self-pay | Admitting: *Deleted

## 2023-01-13 NOTE — Telephone Encounter (Signed)
PT can not keep the Shared Decision appt. Email is best, she said. Not too adept at [facility] and can not always answer the phone. Thank you.

## 2023-01-14 ENCOUNTER — Encounter: Payer: Self-pay | Admitting: *Deleted

## 2023-01-14 NOTE — Telephone Encounter (Signed)
Attempted to contact pt at both numbers listed. Unable to leave a message. Will send mychart message regarding rescheduling SDMV.

## 2023-02-16 ENCOUNTER — Telehealth: Payer: Self-pay | Admitting: Acute Care

## 2023-02-16 ENCOUNTER — Ambulatory Visit (HOSPITAL_BASED_OUTPATIENT_CLINIC_OR_DEPARTMENT_OTHER): Payer: Medicare Other

## 2023-02-16 ENCOUNTER — Encounter: Payer: Medicare Other | Admitting: Acute Care

## 2023-02-16 ENCOUNTER — Telehealth: Payer: Self-pay

## 2023-02-16 NOTE — Telephone Encounter (Signed)
Returned call from VM.  Patient inquiring about why her LDCT appt was cancelled for today.  Our NP had a sdmv appt with the patient this morning and was unable to reach the patient by phone.  Call was a no show and the provider requests the LDCT be cancelled, as it is a requirement of our program to complete the sdmv first.  Left patient a VM that we returned her call and message to state we can reschedule her visits.

## 2023-02-16 NOTE — Telephone Encounter (Signed)
No return calls from patient.  SDMV No show and LDCT has been cancelled

## 2023-02-16 NOTE — Telephone Encounter (Signed)
I have attempted to call the patient x 2 for our scheduled shared decision making visit prior to her Ct Chest which is scheduled for today at 2 pm. There  was no answer either time. I have left HIPAA compliant messages with our contact information and I have requested x 2 she return the call. We have not heard back. We will need to cancel her scan and reschedule both her SDMV and scan

## 2023-02-16 NOTE — Telephone Encounter (Signed)
Patient is calling because she said she was supposed to get a call from the Shared Decision at 10:30 today and she is still waiting for the call.  Please advise.

## 2023-02-16 NOTE — Telephone Encounter (Signed)
Returned call to patient but after first ring, call goes to voicemail.  Left message that we had tried to call for sdmv and were only able to get her VM.  We have cancelled the LDCT due to not completing the sdmv.  Left return call back number

## 2023-02-16 NOTE — Telephone Encounter (Signed)
Patient is calling again because there was some confusion about her Shared Decision visit.  She stated she never got a call at the designated time.  Please call patient to discuss further.  CB# [phone]

## 2023-02-16 NOTE — Telephone Encounter (Signed)
Attempt to call x2 but goes straight to VM.

## 2023-02-17 NOTE — Telephone Encounter (Signed)
Called patient but on first ring the call goes straight to VM. Left message and call back number.  Will send letter to home.

## 2023-02-17 NOTE — Telephone Encounter (Signed)
Patient is calling again because she said she has not gotten a message on her VM and no one has returned her call from previous messages.  Please call to discus further at [phone]

## 2023-02-25 NOTE — Telephone Encounter (Signed)
Patient called and left VM. Called patient, line rang once and went to VM. Left detailed message advising patient to add dept phone number if she has a spam blocker on her phone and we also need to get her rescheduled for [facility] prior to her LDCT. Currently patient has a LDCT on 8/11, this will need to be cancelled - would like to speak with patient prior to cancelling if possible.

## 2023-02-27 ENCOUNTER — Ambulatory Visit (HOSPITAL_BASED_OUTPATIENT_CLINIC_OR_DEPARTMENT_OTHER)
Admission: RE | Admit: 2023-02-27 | Discharge: 2023-02-27 | Disposition: A | Discharge status: Home / Self Care | Payer: Medicare Other | Source: Ambulatory Visit | Attending: Acute Care | Admitting: Acute Care

## 2023-02-27 DIAGNOSIS — Z122 Encounter for screening for malignant neoplasm of respiratory organs: Secondary | ICD-10-CM | POA: Diagnosis present

## 2023-02-27 DIAGNOSIS — Z87891 Personal history of nicotine dependence: Secondary | ICD-10-CM | POA: Insufficient documentation

## 2023-02-27 DIAGNOSIS — F17210 Nicotine dependence, cigarettes, uncomplicated: Secondary | ICD-10-CM | POA: Insufficient documentation

## 2023-03-03 ENCOUNTER — Other Ambulatory Visit: Payer: Self-pay

## 2023-03-03 DIAGNOSIS — Z122 Encounter for screening for malignant neoplasm of respiratory organs: Secondary | ICD-10-CM

## 2023-03-03 DIAGNOSIS — Z87891 Personal history of nicotine dependence: Secondary | ICD-10-CM

## 2023-03-03 DIAGNOSIS — F17210 Nicotine dependence, cigarettes, uncomplicated: Secondary | ICD-10-CM

## 2023-03-15 ENCOUNTER — Telehealth: Payer: Self-pay | Admitting: Acute Care

## 2023-03-15 NOTE — Telephone Encounter (Signed)
Returned call to patient to review results of LDCT.  No suspicious pulmonary nodules noted.  Lung Rads1.  Atherosclerosis and emphysema noted.  Patient is on Lovaza but has never taken statin medication she doesn't think.  Plan to repeat LDCT in one year.  New order placed and results/plan faxed to PCP.  Patient had no questions and verbalized understanding.  Patient was rescheduled by imaging center after missing her sdmv.  She was rescheduled today for 04/13/23 for this appointment.

## 2023-03-17 ENCOUNTER — Other Ambulatory Visit: Payer: Self-pay | Admitting: Pharmacy Technician

## 2023-03-17 ENCOUNTER — Telehealth: Payer: Self-pay

## 2023-03-17 NOTE — Telephone Encounter (Signed)
Auth Submission: NO AUTH NEEDED Site of care: Site of care: [facility] Payer: [employer] Medicare and Tricare for Life Medication & CPT/J Code(s) submitted: Reclast (Zolendronic acid) [id number] Route of submission (phone, fax, portal):  Phone # Fax # Auth type: Buy/Bill PB Units/visits requested: 5mg  x 1 dose Reference number:  Approval from: 03/17/23 to 07/19/23

## 2023-03-23 ENCOUNTER — Encounter: Payer: Self-pay | Admitting: Pulmonary Disease

## 2023-03-23 ENCOUNTER — Ambulatory Visit (INDEPENDENT_AMBULATORY_CARE_PROVIDER_SITE_OTHER): Payer: Medicare Other | Admitting: Pulmonary Disease

## 2023-03-23 VITALS — BP 102/60 | HR 65 | Temp 97.1°F | Ht 61.0 in | Wt 133.2 lb

## 2023-03-23 DIAGNOSIS — G4733 Obstructive sleep apnea (adult) (pediatric): Secondary | ICD-10-CM | POA: Diagnosis not present

## 2023-03-23 DIAGNOSIS — R9389 Abnormal findings on diagnostic imaging of other specified body structures: Secondary | ICD-10-CM

## 2023-03-23 DIAGNOSIS — Z720 Tobacco use: Secondary | ICD-10-CM | POA: Diagnosis not present

## 2023-03-23 DIAGNOSIS — G4700 Insomnia, unspecified: Secondary | ICD-10-CM | POA: Diagnosis not present

## 2023-03-23 NOTE — Patient Instructions (Signed)
We will see you back in the office in about 3 months  Continue your trazodone  If you spend some more time thinking about the CPAP and feel like you will not be able to get back to using it, you can return it to the medical supply company  Regular exercises does help sleep  Consolidated your hours in bed will help your sleep Try and only spend the amount of time you want to sleep for in bed  Continue to cut down on cigarette smoking as this is a stimulant and will continue to disrupt your sleep  Sleep with the head of the bed elevated Sleeping on your side may also help  Call us with significant concerns

## 2023-03-23 NOTE — Progress Notes (Unsigned)
Betty Carrillo    [id number]    03-22-1956  Primary Care Physician:Kaplan, Isidor Holts., PA-C  Referring Physician: Richmond Campbell., PA-C [address]  [place] [address]  Chief complaint:  ***  HPI:  ***  Pets: Occupation: Exposures: Smoking history: Travel history: Relevant family history:  Outpatient Encounter Medications as of 03/23/2023  Medication Sig   calcium-vitamin D (OSCAL WITH D) 500-200 MG-UNIT tablet Take 1 tablet by mouth.   Multiple Vitamin (MULTIVITAMIN) capsule Take 1 capsule by mouth daily.   omega-3 acid ethyl esters (LOVAZA) 1 g capsule Take by mouth 2 (two) times daily.   vitamin k 100 MCG tablet Take 100 mcg by mouth daily.   [DISCONTINUED] antiseptic oral rinse (BIOTENE) LIQD 15 mLs by Mouth Rinse route as needed for dry mouth.   [DISCONTINUED] calcium carbonate (OS-CAL) 1250 (500 Ca) MG chewable tablet Chew 1 tablet by mouth daily.   [DISCONTINUED] triamcinolone (KENALOG) 0.1 % SMARTSIG:1 Application Topical 2-3 Times Daily   No facility-administered encounter medications on file as of 03/23/2023.    Allergies as of 03/23/2023 - Review Complete 03/23/2023  Allergen Reaction Noted   Sulfamethoxazole Other (See Comments) 09/16/2015   Sulfonamide derivatives Rash 09/03/2009    Past Medical History:  Diagnosis Date   Anal fissure    Arthritis    Colon polyp    Depression    Fibromyalgia    Hearing deficit    Hypertension    was dx some time ago , was placed on medication then stopped to manage with diet and exercise ,    Primary localized osteoarthritis of left knee 08/22/2018    Past Surgical History:  Procedure Laterality Date   CARPAL TUNNEL RELEASE Bilateral    COLONOSCOPY W/ POLYPECTOMY     PARTIAL KNEE ARTHROPLASTY Right    PARTIAL KNEE ARTHROPLASTY Left 08/22/2018   Procedure: UNICOMPARTMENTAL KNEE;  Surgeon: Teryl Lucy, MD;  Location: [facility];  Service: Orthopedics;  Laterality: Left;  Adductor Block   TUBAL  LIGATION      Family History  Problem Relation Age of Onset   Bone cancer Father    Liver disease Father    Diabetes Mother    Colitis Mother    Heart disease Mother    Colitis Sister    Colon cancer Neg Hx     Social History   Socioeconomic History   Marital status: Married    Spouse name: Not on file   Number of children: 2   Years of education: Not on file   Highest education level: Not on file  Occupational History    Employer: [id number]  Tobacco Use   Smoking status: Every Day    Current packs/day: 1.00    Average packs/day: 1 pack/day for 53.7 years (53.7 ttl pk-yrs)    Types: Cigarettes    Start date: 38   Smokeless tobacco: Never   Tobacco comments:    Smokes 2 packs per day   Substance and Sexual Activity   Alcohol use: No   Drug use: No   Sexual activity: Not on file  Other Topics Concern   Not on file  Social History Narrative   Not on file   Social Determinants of Health   Financial Resource Strain: Not on file  Food Insecurity: Low Risk  (10/25/2022)   [facility]   Hunger Vital Sign    Worried About Running Out of Food in the Last  Year: Never true    Ran Out of Food in the Last Year: Never true  Transportation Needs: Not on file (10/25/2022)  Physical Activity: Not on file  Stress: Not on file  Social Connections: Not on file  Intimate Partner Violence: Not on file    Review of Systems  Vitals:   03/23/23 0831  BP: 102/60  Pulse: 65  Temp: (!) 97.1 F (36.2 C)  SpO2: 97%     Physical Exam   Data Reviewed: ***  Assessment:  ***  Plan/Recommendations: ***   Virl Diamond MD [facility] 03/23/2023, 9:12 AM  CC: Richmond Campbell., PA-C

## 2023-03-28 ENCOUNTER — Ambulatory Visit (INDEPENDENT_AMBULATORY_CARE_PROVIDER_SITE_OTHER): Payer: Medicare Other

## 2023-03-28 ENCOUNTER — Encounter: Payer: Self-pay | Admitting: Podiatry

## 2023-03-28 ENCOUNTER — Ambulatory Visit (INDEPENDENT_AMBULATORY_CARE_PROVIDER_SITE_OTHER): Payer: Medicare Other | Admitting: Podiatry

## 2023-03-28 DIAGNOSIS — M778 Other enthesopathies, not elsewhere classified: Secondary | ICD-10-CM | POA: Diagnosis not present

## 2023-03-28 DIAGNOSIS — M7752 Other enthesopathy of left foot: Secondary | ICD-10-CM | POA: Diagnosis not present

## 2023-03-28 DIAGNOSIS — M2042 Other hammer toe(s) (acquired), left foot: Secondary | ICD-10-CM | POA: Diagnosis not present

## 2023-03-28 MED ORDER — TRIAMCINOLONE ACETONIDE 10 MG/ML IJ SUSP
10.0000 mg | Freq: Once | INTRAMUSCULAR | Status: AC
Start: 2023-03-28 — End: 2023-03-28
  Administered 2023-03-28: 10 mg via INTRA_ARTICULAR

## 2023-03-28 NOTE — Progress Notes (Signed)
Subjective:   Patient ID: Betty Carrillo, female   DOB: 67 y.o.   MRN: [id number]   HPI Patient presents with digital deformities of the left foot and inflammation fluid of the distal fourth toe left painful when pressed.  States both have been bothering her and she has had previous surgery by me several years ago neurovasc   ROS      Objective:  Physical Exam  There are status intact muscle strength adequate with patient found to have inflammation of the distal intra phalangeal joint digit 4 left painful when pressed with adjacent hammertoe deformities with derotational component to the toes     Assessment:  Difficult to make complete determination between inflammatory capsulitis with arthritis versus hammertoe deformities     Plan:  H and P educated her on hammertoe deformities close discussed the possibility for digital correction but can a focus today on the fourth toe of the left.  I did sterile prep I injected the inner phalangeal joint 3 mg dexamethasone Kenalog 5 mg Xylocaine I advised on cushioning wider shoes reappoint and may require surgery in the long run to adjacent digits also  X-rays indicate significant arthritis of the inner phalangeal joint digit 4 left with rotation of 345

## 2023-03-30 ENCOUNTER — Ambulatory Visit (INDEPENDENT_AMBULATORY_CARE_PROVIDER_SITE_OTHER): Payer: Medicare Other

## 2023-03-30 VITALS — BP 108/58 | HR 54 | Temp 97.8°F | Resp 14 | Ht 61.0 in | Wt 133.2 lb

## 2023-03-30 DIAGNOSIS — M810 Age-related osteoporosis without current pathological fracture: Secondary | ICD-10-CM

## 2023-03-30 MED ORDER — ZOLEDRONIC ACID 5 MG/100ML IV SOLN
5.0000 mg | Freq: Once | INTRAVENOUS | Status: AC
  Administered 2023-03-30: 5 mg via INTRAVENOUS
  Filled 2023-03-30: qty 100

## 2023-03-30 MED ORDER — DIPHENHYDRAMINE HCL 25 MG PO CAPS
25.0000 mg | ORAL_CAPSULE | Freq: Once | ORAL | Status: AC
  Administered 2023-03-30: 25 mg via ORAL
  Filled 2023-03-30: qty 1

## 2023-03-30 MED ORDER — ACETAMINOPHEN 325 MG PO TABS
650.0000 mg | ORAL_TABLET | Freq: Once | ORAL | Status: AC
  Administered 2023-03-30: 650 mg via ORAL
  Filled 2023-03-30: qty 2

## 2023-03-30 NOTE — Progress Notes (Signed)
Diagnosis: Osteoporosis  Provider:  Chilton Greathouse MD  Procedure: IV Infusion  IV Type: Peripheral, IV Location: R Antecubital  Reclast (Zolendronic Acid), Dose: 5 mg  Infusion Start Time: 0849  Infusion Stop Time: 0917  Post Infusion IV Care: Observation period completed and peripheral IV discontinued  Discharge: Condition: Stable, Destination: Home . AVS Provided  Performed by:  Wyvonne Lenz, RN

## 2023-04-13 ENCOUNTER — Ambulatory Visit: Payer: Medicare Other | Admitting: Acute Care

## 2023-04-13 ENCOUNTER — Encounter: Payer: Self-pay | Admitting: Acute Care

## 2023-04-13 DIAGNOSIS — F17210 Nicotine dependence, cigarettes, uncomplicated: Secondary | ICD-10-CM | POA: Diagnosis not present

## 2023-04-13 NOTE — Progress Notes (Signed)
Virtual Visit via Telephone Note  I connected with Barbera Hinze on 04/13/23 at  8:30 AM EDT by telephone and verified that I am speaking with the correct person using two identifiers.  Location: Patient: At home Provider: [address] [place], [place], [address]    I discussed the limitations, risks, security and privacy concerns of performing an evaluation and management service by telephone and the availability of in person appointments. I also discussed with the patient that there may be a patient responsible charge related to this service. The patient expressed understanding and agreed to proceed.    Shared Decision Making Visit Lung Cancer Screening Program [phone])   Eligibility: Age 67 y.o. Pack Years Smoking History Calculation 65 pack year smoking history (# packs/per year x # years smoked) Recent History of coughing up blood  no Unexplained weight loss? no ( >Than 15 pounds within the last 6 months ) Prior History Lung / other cancer no (Diagnosis within the last 5 years already requiring surveillance chest CT Scans). Smoking Status Current Smoker Former Smokers: Years since quit:  NA  Quit Date:  NA  Visit Components: Discussion included one or more decision making aids. yes Discussion included risk/benefits of screening. yes Discussion included potential follow up diagnostic testing for abnormal scans. yes Discussion included meaning and risk of over diagnosis. yes Discussion included meaning and risk of False Positives. yes Discussion included meaning of total radiation exposure. yes  Counseling Included: Importance of adherence to annual lung cancer LDCT screening. yes Impact of comorbidities on ability to participate in the program. yes Ability and willingness to under diagnostic treatment. yes  Smoking Cessation Counseling: Current Smokers:  Discussed importance of smoking cessation. yes Information about tobacco cessation classes and interventions  provided to patient. yes Patient provided with "ticket" for LDCT Scan. yes Symptomatic Patient. no  Counseling NA Diagnosis Code: Tobacco Use Z72.0 Asymptomatic Patient yes  Counseling (Intermediate counseling: > three minutes counseling) A2130 Former Smokers:  Discussed the importance of maintaining cigarette abstinence. yes Diagnosis Code: Personal History of Nicotine Dependence. Q65.784 Information about tobacco cessation classes and interventions provided to patient. Yes Patient provided with "ticket" for LDCT Scan. yes Written Order for Lung Cancer Screening with LDCT placed in Epic. Yes (CT Chest Lung Cancer Screening Low Dose W/O CM) [id number] Z12.2-Screening of respiratory organs Z87.891-Personal history of nicotine dependence  I have spent 25 minutes of face to face/ virtual visit   time with  Ms. Branford discussing the risks and benefits of lung cancer screening. We viewed / discussed a power point together that explained in detail the above noted topics. We paused at intervals to allow for questions to be asked and answered to ensure understanding.We discussed that the single most powerful action that she can take to decrease her risk of developing lung cancer is to quit smoking. We discussed whether or not she is ready to commit to setting a quit date. We discussed options for tools to aid in quitting smoking including nicotine replacement therapy, non-nicotine medications, support groups, Quit Smart classes, and behavior modification. We discussed that often times setting smaller, more achievable goals, such as eliminating 1 cigarette a day for a week and then 2 cigarettes a day for a week can be helpful in slowly decreasing the number of cigarettes smoked. This allows for a sense of accomplishment as well as providing a clinical benefit. I provided  her  with smoking cessation  information  with contact information for community resources, classes, free  nicotine replacement therapy, and  access to mobile apps, text messaging, and on-line smoking cessation help. I have also provided  her  the office contact information in the event she needs to contact me, or the screening staff. We discussed the time and location of the scan, and that either Abigail Miyamoto RN, Karlton Lemon, RN  or I will call / send a letter with the results within 24-72 hours of receiving them. The patient verbalized understanding of all of  the above and had no further questions upon leaving the office. They have my contact information in the event they have any further questions.  I spent 3-4 minutes counseling on smoking cessation and the health risks of continued tobacco abuse.  I explained to the patient that there has been a high incidence of coronary artery disease noted on these exams. I explained that this is a non-gated exam therefore degree or severity cannot be determined. This patient is not on statin therapy. I have asked the patient to follow-up with their PCP regarding any incidental finding of coronary artery disease and management with diet or medication as their PCP  feels is clinically indicated. The patient verbalized understanding of the above and had no further questions upon completion of the visit.      Bevelyn Ngo, NP 04/13/2023

## 2023-04-13 NOTE — Patient Instructions (Signed)
 Thank you for participating in the  Lung Cancer Screening Program. It was our pleasure to meet you today. We will call you with the results of your scan within the next few days. Your scan will be assigned a Lung RADS category score by the physicians reading the scans.  This Lung RADS score determines follow up scanning.  See below for description of categories, and follow up screening recommendations. We will be in touch to schedule your follow up screening annually or based on recommendations of our providers. We will fax a copy of your scan results to your Primary Care Physician, or the physician who referred you to the program, to ensure they have the results. Please call the office if you have any questions or concerns regarding your scanning experience or results.  Our office number is [phone]. Please speak with Abigail Miyamoto, RN., Karlton Lemon RN, or Pietro Cassis RN. They are  our Lung Cancer Screening RN.'s If They are unavailable when you call, Please leave a message on the voice mail. We will return your call at our earliest convenience.This voice mail is monitored several times a day.  Remember, if your scan is normal, we will scan you annually as long as you continue to meet the criteria for the program. (Age 68-80, Current smoker or smoker who has quit within the last 15 years). If you are a smoker, remember, quitting is the single most powerful action that you can take to decrease your risk of lung cancer and other pulmonary, breathing related problems. We know quitting is hard, and we are here to help.  Please let us know if there is anything we can do to help you meet your goal of quitting. If you are a former smoker, Counselling psychologist. We are proud of you! Remain smoke free! Remember you can refer friends or family members through the number above.  We will screen them to make sure they meet criteria for the program. Thank you for helping [place] take better care of you  by participating in Lung Screening.   Lung RADS Categories:  Lung RADS 1: no nodules or definitely non-concerning nodules.  Recommendation is for a repeat annual scan in 12 months.  Lung RADS 2:  nodules that are non-concerning in appearance and behavior with a very low likelihood of becoming an active cancer. Recommendation is for a repeat annual scan in 12 months.  Lung RADS 3: nodules that are probably non-concerning , includes nodules with a low likelihood of becoming an active cancer.  Recommendation is for a 40-month repeat screening scan. Often noted after an upper respiratory illness. We will be in touch to make sure you have no questions, and to schedule your 22-month scan.  Lung RADS 4 A: nodules with concerning findings, recommendation is most often for a follow up scan in 3 months or additional testing based on our provider's assessment of the scan. We will be in touch to make sure you have no questions and to schedule the recommended 3 month follow up scan.  Lung RADS 4 B:  indicates findings that are concerning. We will be in touch with you to schedule additional diagnostic testing based on our provider's  assessment of the scan.  You can receive free nicotine replacement therapy ( patches, gum or mints) by calling 1-800-QUIT NOW. Please call so we can get you on the path to becoming  a non-smoker. I know it is hard, but you can do this!  Other options for assistance in  smoking cessation ( As covered by your insurance benefits)  Hypnosis for smoking cessation  [employer] [phone]  Acupuncture for smoking cessation  [employer] [phone]

## 2023-04-18 ENCOUNTER — Ambulatory Visit: Payer: Medicare Other | Admitting: Podiatry

## 2023-05-24 ENCOUNTER — Ambulatory Visit (INDEPENDENT_AMBULATORY_CARE_PROVIDER_SITE_OTHER): Payer: Medicare Other | Admitting: Podiatry

## 2023-05-24 DIAGNOSIS — M7752 Other enthesopathy of left foot: Secondary | ICD-10-CM | POA: Diagnosis not present

## 2023-05-24 DIAGNOSIS — G5752 Tarsal tunnel syndrome, left lower limb: Secondary | ICD-10-CM

## 2023-05-24 MED ORDER — MELOXICAM 15 MG PO TABS: 15.0000 mg | ORAL_TABLET | Freq: Every day | ORAL | 3 refills | Status: AC

## 2023-05-24 NOTE — Patient Instructions (Signed)
VISIT SUMMARY:  You came in today because of severe pain in the last three toes of your left foot, which started suddenly last night. The pain is so intense that it affects your ability to walk normally. You have a history of plantar fasciitis, but this pain is different and was not caused by any injury. We discussed your symptoms and planned the next steps for diagnosis and treatment.  YOUR PLAN:  -FOOT PAIN: You have severe pain in the last three toes of your left foot, which is likely due to a condition called hammer toes, where the toes are abnormally bent. We will run some blood tests to check for gout and other possible causes. In the meantime, you will take Meloxicam once daily to help with inflammation and pain. We will also provide padding for your toes to reduce pressure. If the blood tests do not show any issues, we will consider an MRI to check for tarsal tunnel syndrome, a condition where a nerve in the foot is compressed.  INSTRUCTIONS:  Please take Meloxicam once daily as prescribed. Use the padding provided to alleviate pressure on your toes. We will follow up in one month to see how you are doing. If the Meloxicam does not help, we may prescribe a different medication. Make sure to get the blood tests done as soon as possible so we can review the results at your next visit.

## 2023-05-25 LAB — CBC WITH DIFFERENTIAL/PLATELET
Basophils Absolute: 0 10*3/uL (ref 0.0–0.2)
Basos: 0 %
EOS (ABSOLUTE): 0.1 10*3/uL (ref 0.0–0.4)
Eos: 1 %
Hematocrit: 41.3 % (ref 34.0–46.6)
Hemoglobin: 13.3 g/dL (ref 11.1–15.9)
Immature Grans (Abs): 0 10*3/uL (ref 0.0–0.1)
Immature Granulocytes: 0 %
Lymphocytes Absolute: 1.8 10*3/uL (ref 0.7–3.1)
Lymphs: 20 %
MCH: 31.2 pg (ref 26.6–33.0)
MCHC: 32.2 g/dL (ref 31.5–35.7)
MCV: 97 fL (ref 79–97)
Monocytes Absolute: 0.8 10*3/uL (ref 0.1–0.9)
Monocytes: 9 %
Neutrophils Absolute: 6.1 10*3/uL (ref 1.4–7.0)
Neutrophils: 70 %
Platelets: 242 10*3/uL (ref 150–450)
RBC: 4.26 x10E6/uL (ref 3.77–5.28)
RDW: 12 % (ref 11.7–15.4)
WBC: 8.8 10*3/uL (ref 3.4–10.8)

## 2023-05-25 LAB — BASIC METABOLIC PANEL
BUN/Creatinine Ratio: 22 (ref 12–28)
BUN: 21 mg/dL (ref 8–27)
CO2: 26 mmol/L (ref 20–29)
Calcium: 9.3 mg/dL (ref 8.7–10.3)
Chloride: 100 mmol/L (ref 96–106)
Creatinine, Ser: 0.97 mg/dL (ref 0.57–1.00)
Glucose: 93 mg/dL (ref 70–99)
Potassium: 4.5 mmol/L (ref 3.5–5.2)
Sodium: 141 mmol/L (ref 134–144)
eGFR: 64 mL/min/{1.73_m2} (ref >59)

## 2023-05-25 LAB — C-REACTIVE PROTEIN: CRP: 8 mg/L (ref 0–10)

## 2023-05-25 LAB — SEDIMENTATION RATE: Sed Rate: 4 mm/h (ref 0–40)

## 2023-05-25 LAB — URIC ACID: Uric Acid: 4.2 mg/dL (ref 3.0–7.2)

## 2023-05-25 NOTE — Progress Notes (Signed)
  Subjective:  Patient ID: Betty Carrillo, female    DOB: February 15, 1956,  MRN: [id number]  Chief Complaint  Patient presents with   Hammer Toe    Left foot. Seen PCP recently. Has been seen before. Ongoing for about 3 months. Last night reports pain radiating from 1-3 toes on left foot to heel.     Discussed the use of AI scribe software for clinical note transcription with the patient, who gave verbal consent to proceed.  History of Present Illness   The patient presents with acute onset foot pain, specifically in the last three toes of the left foot. The pain is so severe that she is unable to walk normally and must walk sideways. The pain is located at the tips of the toes and is exacerbated by pressure. The patient has a history of plantar fasciitis and has tried special shoes to alleviate the pain, but to no avail. She reports that the pain started the night before the appointment and there was no preceding injury. The patient denies any medical issues and has no known history of gout. She has previously seen another doctor for a similar issue and received a steroid shot, which did not provide lasting relief.          Objective:    Physical Exam   MUSCULOSKELETAL: Adducto varus contracture of the lesser toes with pain at the tips, tenderness at the tips of the toes of the adducto varus contractures that are semi reducible. Pain on palpation of the tarsal tunnel with radiation proximally and distally. SKIN: Left foot warm and well perfused with palpable pulses, good capillary fill time.       No images are attached to the encounter.    Results          Assessment:   1. Capsulitis of toe, left   2. Tarsal tunnel syndrome, left      Plan:  Patient was evaluated and treated and all questions answered.  Assessment and Plan    Foot Pain   She presents with severe pain in the last three toes of her left foot, characterized by adducto varus contracture (hammer toes) and tenderness  at the tips. The pain radiates proximally and distally upon palpation of the tarsal tunnel, with no history of gout or injury but a previous history of plantar fasciitis. We will order uric acid, ESR, CBC, and CMP to evaluate for gouty arthropathy and prescribe Meloxicam once daily for inflammation and pain control. Padding for the toes will be provided to alleviate pressure. If lab work is unrevealing, an MRI will be planned to evaluate for tarsal tunnel syndrome. A follow-up visit is scheduled in one month, and if Meloxicam proves ineffective, we will consider prescribing a methylprednisolone taper.       Lab work was normal.  MRI is ordered to evaluate for tarsal tunnel syndrome   Return in about 1 month (around 06/23/2023) for follow up on toes of left foot and lab work .

## 2023-06-10 ENCOUNTER — Ambulatory Visit
Admission: RE | Admit: 2023-06-10 | Discharge: 2023-06-10 | Disposition: A | Discharge status: Home / Self Care | Payer: Medicare Other | Source: Ambulatory Visit | Attending: Podiatry

## 2023-06-10 DIAGNOSIS — G5752 Tarsal tunnel syndrome, left lower limb: Secondary | ICD-10-CM

## 2023-06-10 MED ORDER — GADOPICLENOL 0.5 MMOL/ML IV SOLN
5.5000 mL | Freq: Once | INTRAVENOUS | Status: AC | PRN
  Administered 2023-06-10: 5.5 mL via INTRAVENOUS

## 2023-06-30 ENCOUNTER — Ambulatory Visit: Payer: Medicare Other | Admitting: Podiatry

## 2023-06-30 ENCOUNTER — Encounter: Payer: Self-pay | Admitting: Podiatry

## 2023-06-30 DIAGNOSIS — M79672 Pain in left foot: Secondary | ICD-10-CM

## 2023-06-30 DIAGNOSIS — M2012 Hallux valgus (acquired), left foot: Secondary | ICD-10-CM

## 2023-06-30 DIAGNOSIS — M2042 Other hammer toe(s) (acquired), left foot: Secondary | ICD-10-CM | POA: Diagnosis not present

## 2023-06-30 DIAGNOSIS — M21612 Bunion of left foot: Secondary | ICD-10-CM

## 2023-06-30 DIAGNOSIS — Q742 Other congenital malformations of lower limb(s), including pelvic girdle: Secondary | ICD-10-CM

## 2023-06-30 NOTE — Patient Instructions (Signed)
Posterior Tibial Tendinitis  Posterior tibial tendinitis is irritation of a tendon called the posterior tibial tendon. Your posterior tibial tendon is a cord-like tissue that connects bones of your lower leg and foot to a muscle that: Supports your arch. Helps you raise up on your toes. Helps you turn your foot down and in. This condition causes foot and ankle pain. It can also lead to a flat foot. What are the causes? This condition is most often caused by repeated stress to the tendon (overuse injury). It can also be caused by a sudden injury that stresses the tendon, such as landing on your foot after jumping or falling. What increases the risk? This condition is more likely to develop in: People who play a sport that involves putting a lot of pressure on the feet, such as: Basketball. Tennis. Soccer. Hockey. Runners. Females who are older than 68 years of age and are overweight. People with diabetes. People with decreased foot stability. People with flat feet. What are the signs or symptoms? Symptoms include: Pain in the inner ankle. Pain at the arch of your foot. Pain that gets worse with running, walking, or standing. Swelling on the inside of your ankle and foot. Weakness in your ankle or foot. Inability to stand up on tiptoe. Flattening of the arch of your foot. How is this diagnosed? This condition may be diagnosed based on: Your symptoms. Your medical history. A physical exam. Tests, such as: X-ray. MRI. Ultrasound. How is this treated? This condition may be treated by: Putting ice to the injured area. Taking NSAIDs, such as ibuprofen, to reduce pain and swelling. Wearing a special shoe or shoe insert to support your arch (orthotic). Having physical therapy. Replacing high-impact exercise with low-impact exercise, such as swimming or cycling. If your symptoms do not improve with these treatments, you may need to wear a splint, removable walking boot, or short  leg cast for 6-8 weeks to keep your foot and ankle still (immobilized). Follow these instructions at home: If you have a cast, splint, or boot: Keep it clean and dry. Check the skin around it every day. Tell your health care provider about any concerns. If you have a cast: Do not stick anything inside it to scratch your skin. Doing that increases your risk of infection. You may put lotion on dry skin around the edges of the cast. Do not put lotion on the skin underneath the cast. If you have a splint or boot: Wear it as told by your health care provider. Remove it only as told by your health care provider. Loosen it if your toes tingle, become numb, or turn cold and blue. Bathing Do not take baths, swim, or use a hot tub until your health care provider approves. Ask your health care provider if you may take showers. If your cast, splint, or boot is not waterproof: Do not let it get wet. Cover it with a waterproof covering while you take a bath or a shower. Managing pain and swelling   If directed, put ice on the injured area. If you have a removable splint or boot, remove it as told by your health care provider. Put ice in a plastic bag. Place a towel between your skin and the bag or between your cast and the bag. Leave the ice on for 20 minutes, 2-3 times a day. Move your toes often to reduce stiffness and swelling. Raise (elevate) the injured area above the level of your heart while you are sitting   or lying down. Activity Do not use the injured foot to support your body weight until your health care provider says that you can. Use crutches as told by your health care provider. Do not do activities that make pain or swelling worse. Ask your health care provider when it is safe to drive if you have a cast, splint, or boot on your foot. Return to your normal activities as told by your health care provider. Ask your health care provider what activities are safe for you. Do exercises as  told by your health care provider. General instructions Take over-the-counter and prescription medicines only as told by your health care provider. If you have an orthotic, use it as told by your health care provider. Keep all follow-up visits as told by your health care provider. This is important. How is this prevented? Wear footwear that is appropriate to your athletic activity. Avoid athletic activities that cause pain or swelling in your ankle or foot. Before being active, do range-of-motion and stretching exercises. If you develop pain or swelling while training, stop training. If you have pain or swelling that does not improve after a few days of rest, see your health care provider. If you start a new athletic activity, start gradually so you can build up your strength and flexibility. Contact a health care provider if: Your symptoms get worse. Your symptoms do not improve in 6-8 weeks. You develop new, unexplained symptoms. Your splint, boot, or cast gets damaged. Summary Posterior tibial tendinitis is irritation of a tendon called the posterior tibial tendon. This condition is most often caused by repeated stress to the tendon (overuse injury). This condition causes foot pain and ankle pain. It can also lead to a flat foot. This condition may be treated by not doing high-impact activities, applying ice, having physical therapy, wearing orthotics, and wearing a cast, splint, or boot if needed. This information is not intended to replace advice given to you by your health care provider. Make sure you discuss any questions you have with your health care provider. Document Revised: 10/31/2018 Document Reviewed: 09/07/2018 [employer] Patient Education  2020 [employer]  Posterior Tibial Tendinitis Rehab Ask your health care provider which exercises are safe for you. Do exercises exactly as told by your health care provider and adjust them as directed. It is normal to feel mild  stretching, pulling, tightness, or discomfort as you do these exercises. Stop right away if you feel sudden pain or your pain gets worse. Do not begin these exercises until told by your health care provider. Stretching and range-of-motion exercises These exercises warm up your muscles and joints and improve the movement and flexibility in your ankle and foot. These exercises may also help to relieve pain. Standing wall calf stretch, knee straight   Stand with your hands against a wall. Extend your left / right leg behind you, and bend your front knee slightly. If directed, place a folded washcloth under the arch of your foot for support. Point the toes of your back foot slightly inward. Keeping your heels on the floor and your back knee straight, shift your weight toward the wall. Do not allow your back to arch. You should feel a gentle stretch in your upper left / right calf. Hold this position for 10 seconds. Repeat 10 times. Complete this exercise 2 times a day. Standing wall calf stretch, knee bent Stand with your hands against a wall. Extend your left / right leg behind you, and bend your front   knee slightly. If directed, place a folded washcloth under the arch of your foot for support. Point the toes of your back foot slightly inward. Unlock your back knee so it is bent. Keep your heels on the floor. You should feel a gentle stretch deep in your lower left / right calf. Hold this position for 10 seconds. Repeat 10 times. Complete this exercise 2 times a day. Strengthening exercises These exercises build strength and endurance in your ankle and foot. Endurance is the ability to use your muscles for a long time, even after they get tired. Ankle inversion with band Secure one end of a rubber exercise band or tubing to a fixed object, such as a table leg or a pole, that will stay still when the band is pulled. Loop the other end of the band around the middle of your left / right foot. Sit  on the floor facing the object with your left / right leg extended. The band or tube should be slightly tense when your foot is relaxed. Leading with your big toe, slowly bring your left / right foot and ankle inward, toward your other foot (inversion). Hold this position for 10 seconds. Slowly return your foot to the starting position. Repeat 10 times. Complete this exercise 2 times a day. Towel curls   Sit in a chair on a non-carpeted surface, and put your feet on the floor. Place a towel in front of your feet. Keeping your heel on the floor, put your left / right foot on the towel. Pull the towel toward you by grabbing the towel with your toes and curling them under. Keep your heel on the floor while you do this. Let your toes relax. Grab the towel with your toes again. Keep going until the towel is completely underneath your foot. Repeat 10 times. Complete this exercise 2 times a day. Balance exercise This exercise improves or maintains your balance. Balance is important in preventing falls. Single leg stand Without wearing shoes, stand near a railing or in a doorway. You may hold on to the railing or door frame as needed for balance. Stand on your left / right foot. Keep your big toe down on the floor and try to keep your arch lifted. If balancing in this position is too easy, try the exercise with your eyes closed or while standing on a pillow. Hold this position for 10 seconds. Repeat 10 times. Complete this exercise 2 times a day. This information is not intended to replace advice given to you by your health care provider. Make sure you discuss any questions you have with your health care provider.  

## 2023-06-30 NOTE — Progress Notes (Signed)
Subjective:  Patient ID: Betty Carrillo, female    DOB: 05/10/1956,  MRN: [id number]  Chief Complaint  Patient presents with   Foot Pain    Left foot, she reports she is doing well and has questions about the results of test. The toe is doing well.     Discussed the use of AI scribe software for clinical note transcription with the patient, who gave verbal consent to proceed.  History of Present Illness   The patient, with a history of foot pain, reports improvement in symptoms with medication. She describes a significant reduction in pain, particularly in the back of the foot. She also notes that the pain seems to worsen in the summer months when she wears flatter shoes, suggesting a possible link to Achilles tendon strain.  The patient also reports intermittent discomfort in her toes, which she describes as "goofy" and "going down." She denies any consistent pain in this area, but expresses confusion about the variability of the discomfort.  Additionally, the patient mentions an extra bone in her foot, present since her teenage years, which may be contributing to inflammation and tenderness. She also expresses concern about a potential link between this bone and her current liver problems.  Finally, the patient discusses her medication regimen, expressing uncertainty about what to do once the current medication runs out. She is open to trying over-the-counter options like Aleve or ibuprofen as a potential supplement or replacement.          Objective:    Physical Exam   MUSCULOSKELETAL: Hallux valgus deformity observed. Reducible hammer toe deformities, non tender. Accessory navicular prominence, non tender.       No images are attached to the encounter.    Results   RADIOLOGY Foot MRI: Moderate distal Achilles tendinosis, tendinosis of the peroneus brevis, and an accessory navicular. (06/10/2023)      Assessment:   1. 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 Hallux valgus with bunions, left   3. Hammertoe of left foot      Plan:  Patient was evaluated and treated and all questions answered.  Assessment and Plan    Foot Pain   The MRI revealed inflammation in the tendons on the outside of the ankle, suggesting tendonitis, although no significant findings were present on the MRI, and there are no tears or need for surgery. She experiences more pain in summer, likely due to strain through the Achilles and posterior tibial tendons, exacerbated by flat shoes. The pain has improved with medication. She will continue the current medication as needed and start exercises for the foot and ankle twice daily. A check-in is planned after the medication course to decide if refills are necessary or if management should shift to over-the-counter options like Aleve or ibuprofen.  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 There is no immediate need for surgical intervention unless pain becomes significant. She will continue daily exercises to maintain toe strength and range of motion and should report any increase in pain.  Accessory Navicular   The accessory navicular is non-tender, potentially causing inflammation due to the attached tendon. Exercises aimed at reducing potential inflammation will continue.  Follow-up   Should her foot pain worsen, she is advised to contact the office.          Return if symptoms worsen or fail to improve.

## 2024-02-28 ENCOUNTER — Ambulatory Visit (HOSPITAL_BASED_OUTPATIENT_CLINIC_OR_DEPARTMENT_OTHER)
Admission: RE | Admit: 2024-02-28 | Discharge: 2024-02-28 | Disposition: A | Discharge status: Home / Self Care | Payer: Medicare Other | Source: Ambulatory Visit | Attending: Acute Care | Admitting: Acute Care

## 2024-02-28 DIAGNOSIS — Z122 Encounter for screening for malignant neoplasm of respiratory organs: Secondary | ICD-10-CM | POA: Insufficient documentation

## 2024-02-28 DIAGNOSIS — Z87891 Personal history of nicotine dependence: Secondary | ICD-10-CM | POA: Diagnosis present

## 2024-02-28 DIAGNOSIS — F17210 Nicotine dependence, cigarettes, uncomplicated: Secondary | ICD-10-CM | POA: Insufficient documentation

## 2024-03-09 ENCOUNTER — Telehealth (HOSPITAL_COMMUNITY): Payer: Self-pay | Admitting: Pharmacy Technician

## 2024-03-09 ENCOUNTER — Other Ambulatory Visit: Payer: Self-pay

## 2024-03-09 NOTE — Telephone Encounter (Signed)
 Auth Submission: NO AUTH NEEDED Site of care: [facility] Payer: [employer] Medicare, T[employer] for Life Medication & CPT/J Code(s) submitted: Reclast  (Zolendronic acid) J3489 Diagnosis Code: M81.0 Route of submission (phone, fax, portal): portal Phone # Fax # Auth type: Buy/Bill HB Units/visits requested: 5mg  x 1 dose, q 12 mths Reference number: [id number] Approval from: 03/09/24 to 07/18/24      Dagoberto Armour, CPhT [facility] Phone: [phone] 03/09/2024

## 2024-03-16 ENCOUNTER — Other Ambulatory Visit: Payer: Self-pay | Admitting: Acute Care

## 2024-03-16 DIAGNOSIS — Z122 Encounter for screening for malignant neoplasm of respiratory organs: Secondary | ICD-10-CM

## 2024-03-16 DIAGNOSIS — Z87891 Personal history of nicotine dependence: Secondary | ICD-10-CM

## 2024-03-29 ENCOUNTER — Ambulatory Visit (INDEPENDENT_AMBULATORY_CARE_PROVIDER_SITE_OTHER): Payer: Medicare Other

## 2024-03-29 VITALS — BP 113/67 | HR 56 | Temp 98.3°F | Resp 12 | Ht 61.0 in | Wt 121.0 lb

## 2024-03-29 DIAGNOSIS — M810 Age-related osteoporosis without current pathological fracture: Secondary | ICD-10-CM

## 2024-03-29 MED ORDER — SODIUM CHLORIDE 0.9 % IV SOLN: INTRAVENOUS | Status: DC

## 2024-03-29 MED ORDER — ZOLEDRONIC ACID 5 MG/100ML IV SOLN
5.0000 mg | Freq: Once | INTRAVENOUS | Status: AC
  Administered 2024-03-29: 5 mg via INTRAVENOUS
  Filled 2024-03-29: qty 100

## 2024-03-29 MED ORDER — DIPHENHYDRAMINE HCL 25 MG PO CAPS
25.0000 mg | ORAL_CAPSULE | Freq: Once | ORAL | Status: AC
  Administered 2024-03-29: 25 mg via ORAL
  Filled 2024-03-29: qty 1

## 2024-03-29 MED ORDER — ACETAMINOPHEN 325 MG PO TABS
650.0000 mg | ORAL_TABLET | Freq: Once | ORAL | Status: AC
  Administered 2024-03-29: 650 mg via ORAL
  Filled 2024-03-29: qty 2

## 2024-03-29 NOTE — Progress Notes (Signed)
 Diagnosis: Osteoporosis  Provider:  Mannam, Praveen MD  Procedure: IV Infusion  IV Type: Peripheral, IV Location: R Antecubital  Reclast  (Zolendronic Acid), Dose: 5 mg  Infusion Start Time: 0847  Infusion Stop Time: 0920  Post Infusion IV Care: Observation period completed and Peripheral IV Discontinued  Discharge: Condition: Good, Destination: Home . AVS Provided  Performed by:  Rocky FORBES Sar, RN

## 2024-04-27 ENCOUNTER — Ambulatory Visit (INDEPENDENT_AMBULATORY_CARE_PROVIDER_SITE_OTHER)

## 2024-04-27 ENCOUNTER — Ambulatory Visit: Admitting: Podiatry

## 2024-04-27 ENCOUNTER — Encounter: Payer: Self-pay | Admitting: Podiatry

## 2024-04-27 DIAGNOSIS — M21619 Bunion of unspecified foot: Secondary | ICD-10-CM | POA: Diagnosis not present

## 2024-04-27 DIAGNOSIS — M2041 Other hammer toe(s) (acquired), right foot: Secondary | ICD-10-CM | POA: Diagnosis not present

## 2024-04-27 DIAGNOSIS — M21611 Bunion of right foot: Secondary | ICD-10-CM | POA: Diagnosis not present

## 2024-04-27 MED ORDER — DICLOFENAC SODIUM 75 MG PO TBEC: 75.0000 mg | DELAYED_RELEASE_TABLET | Freq: Two times a day (BID) | ORAL | 2 refills | Status: AC

## 2024-04-27 NOTE — Progress Notes (Signed)
 Subjective:   Patient ID: Betty Carrillo, female   DOB: 68 y.o.   MRN: [id number]   HPI Patient states she is having a lot of pain in the joints of her second digit and third digit distal right foot.  States these have been bad for a number of months and she has not been seen for approximately a year.  Patient states that they are worse at nighttime   ROS      Objective:  Physical Exam  Neurovascular status intact with quite a bit of pain in the distal to phalangeal joint digits 2-3 of the right foot with second digit being worse.  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  Patient had dressing applied to the right second toe reappoint to recheck  X-rays indicate there may be some deformity within the distal 2nd and 3rd digit right no other pathology noted

## 2024-05-28 ENCOUNTER — Ambulatory Visit: Admitting: Podiatry
